# Patient Record
Sex: Female | Born: 1980 | Race: Black or African American | Hispanic: No | Marital: Single | Smoking: Former smoker
Health system: Southern US, Community
[De-identification: ages and names within clinical notes are randomized; demographics above are authoritative.]

## PROBLEM LIST (undated history)

## (undated) DIAGNOSIS — D649 Anemia, unspecified: Secondary | ICD-10-CM

## (undated) DIAGNOSIS — M549 Dorsalgia, unspecified: Secondary | ICD-10-CM

## (undated) HISTORY — PX: INDUCED ABORTION: SHX677

## (undated) HISTORY — PX: DILATION AND CURETTAGE, DIAGNOSTIC / THERAPEUTIC: SUR384

## (undated) HISTORY — PX: WISDOM TOOTH EXTRACTION: SHX21

---

## 2009-04-29 ENCOUNTER — Emergency Department (HOSPITAL_COMMUNITY): Admission: EM | Admit: 2009-04-29 | Discharge: 2009-04-29 | Payer: Self-pay | Admitting: *Deleted

## 2009-06-17 ENCOUNTER — Emergency Department (HOSPITAL_COMMUNITY): Admission: EM | Admit: 2009-06-17 | Discharge: 2009-06-17 | Payer: Self-pay | Admitting: Emergency Medicine

## 2009-06-20 ENCOUNTER — Emergency Department (HOSPITAL_COMMUNITY): Admission: EM | Admit: 2009-06-20 | Discharge: 2009-06-20 | Payer: Self-pay | Admitting: Emergency Medicine

## 2010-09-13 ENCOUNTER — Emergency Department (HOSPITAL_COMMUNITY)
Admission: EM | Admit: 2010-09-13 | Discharge: 2010-09-13 | Payer: Self-pay | Source: Home / Self Care | Admitting: Emergency Medicine

## 2011-01-12 LAB — CBC
HCT: 35.3 % — ABNORMAL LOW (ref 36.0–46.0)
Hemoglobin: 11.8 g/dL — ABNORMAL LOW (ref 12.0–15.0)
Platelets: 176 10*3/uL (ref 150–400)
RDW: 14.4 % (ref 11.5–15.5)
WBC: 7.5 10*3/uL (ref 4.0–10.5)

## 2011-01-12 LAB — URINALYSIS, ROUTINE W REFLEX MICROSCOPIC
Glucose, UA: NEGATIVE mg/dL
Hgb urine dipstick: NEGATIVE
Protein, ur: NEGATIVE mg/dL
Specific Gravity, Urine: 1.022 (ref 1.005–1.030)
pH: 6 (ref 5.0–8.0)

## 2011-01-12 LAB — LIPASE, BLOOD: Lipase: 15 U/L (ref 11–59)

## 2011-01-12 LAB — DIFFERENTIAL
Eosinophils Relative: 0 % (ref 0–5)
Lymphocytes Relative: 14 % (ref 12–46)
Lymphs Abs: 1.1 10*3/uL (ref 0.7–4.0)
Neutro Abs: 6.2 10*3/uL (ref 1.7–7.7)

## 2011-01-12 LAB — COMPREHENSIVE METABOLIC PANEL
AST: 24 U/L (ref 0–37)
BUN: 6 mg/dL (ref 6–23)
CO2: 17 mEq/L — ABNORMAL LOW (ref 19–32)
Calcium: 8.9 mg/dL (ref 8.4–10.5)
Creatinine, Ser: 0.68 mg/dL (ref 0.4–1.2)
GFR calc Af Amer: >60 mL/min (ref >60)
GFR calc non Af Amer: >60 mL/min (ref >60)

## 2011-01-12 LAB — POCT PREGNANCY, URINE: Preg Test, Ur: NEGATIVE

## 2011-01-14 LAB — POCT PREGNANCY, URINE: Preg Test, Ur: POSITIVE

## 2011-04-23 ENCOUNTER — Emergency Department (HOSPITAL_COMMUNITY)
Admission: EM | Admit: 2011-04-23 | Discharge: 2011-04-23 | Disposition: A | Discharge status: Home / Self Care | Payer: Self-pay | Attending: Emergency Medicine | Admitting: Emergency Medicine

## 2011-04-23 DIAGNOSIS — R21 Rash and other nonspecific skin eruption: Secondary | ICD-10-CM | POA: Insufficient documentation

## 2013-02-25 ENCOUNTER — Encounter (HOSPITAL_COMMUNITY): Payer: Self-pay | Admitting: Family Medicine

## 2013-02-25 ENCOUNTER — Emergency Department (HOSPITAL_COMMUNITY)
Admission: EM | Admit: 2013-02-25 | Discharge: 2013-02-25 | Disposition: A | Discharge status: Home / Self Care | Payer: Self-pay | Attending: Emergency Medicine | Admitting: Emergency Medicine

## 2013-02-25 DIAGNOSIS — Y929 Unspecified place or not applicable: Secondary | ICD-10-CM | POA: Insufficient documentation

## 2013-02-25 DIAGNOSIS — X58XXXA Exposure to other specified factors, initial encounter: Secondary | ICD-10-CM | POA: Insufficient documentation

## 2013-02-25 DIAGNOSIS — Y939 Activity, unspecified: Secondary | ICD-10-CM | POA: Insufficient documentation

## 2013-02-25 DIAGNOSIS — S239XXA Sprain of unspecified parts of thorax, initial encounter: Secondary | ICD-10-CM | POA: Insufficient documentation

## 2013-02-25 DIAGNOSIS — F17200 Nicotine dependence, unspecified, uncomplicated: Secondary | ICD-10-CM | POA: Insufficient documentation

## 2013-02-25 DIAGNOSIS — M549 Dorsalgia, unspecified: Secondary | ICD-10-CM

## 2013-02-25 DIAGNOSIS — T148XXA Other injury of unspecified body region, initial encounter: Secondary | ICD-10-CM

## 2013-02-25 MED ORDER — CYCLOBENZAPRINE HCL 10 MG PO TABS: 10.0000 mg | ORAL_TABLET | Freq: Two times a day (BID) | ORAL | Status: DC | PRN

## 2013-02-25 MED ORDER — IBUPROFEN 800 MG PO TABS: 800.0000 mg | ORAL_TABLET | Freq: Three times a day (TID) | ORAL | Status: DC

## 2013-02-25 NOTE — ED Notes (Signed)
C/O left mid back pain. 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 Describes as "tight". Denies numbness or tingling.

## 2013-02-25 NOTE — ED Provider Notes (Signed)
History     CSN: [id number]  Arrival date & time 02/25/13  4098   First MD Initiated Contact with Patient 02/25/13 1002      Chief Complaint  Patient presents with  . Back Pain    (Consider location/radiation/quality/duration/timing/severity/associated sxs/prior treatment) Patient is a 32 y.o. female presenting with back pain. The history is provided by the patient. No language interpreter was used.  Back Pain Location:  Thoracic spine Quality:  Aching Associated symptoms: no fever and no numbness   Associated symptoms comment:  Pain, worse with movement and better with rest, to left lateral thoracic and right lower back. No SOB or painful breathing. No cough or fever. No urinary symptoms.    History reviewed. No pertinent past medical history.  History reviewed. No pertinent past surgical history.  History reviewed. No pertinent family history.  History  Substance Use Topics  . Smoking status: Current Every Day Smoker  . Smokeless tobacco: Not on file  . Alcohol Use: Yes     Comment: occ    OB History   Grav Para Term Preterm Abortions TAB SAB Ect Mult Living                  Review of Systems  Constitutional: Negative for fever and chills.  Respiratory: Negative.   Cardiovascular: Negative.   Gastrointestinal: Negative.   Musculoskeletal: Positive for back pain.       See HPI.  Skin: Negative.   Neurological: Negative.  Negative for numbness.    Allergies  Review of patient's allergies indicates no known allergies.  Home Medications   Current Outpatient Rx  Name  Route  Sig  Dispense  Refill  . ibuprofen (ADVIL,MOTRIN) 200 MG tablet   Oral   Take 100 mg by mouth every 6 (six) hours as needed for pain.           BP 113/69  Pulse 77  Temp(Src) 98.3 F (36.8 C)  Resp 18  SpO2 96%  LMP 02/11/2013  Physical Exam  Constitutional: She appears well-developed and well-nourished.  HENT:  Head: Normocephalic.  Neck: Normal range of motion. Neck  supple.  Cardiovascular: Normal rate and regular rhythm.   Pulmonary/Chest: Effort normal and breath sounds normal.  Abdominal: Soft. Bowel sounds are normal. There is no tenderness. There is no rebound and no guarding.  Musculoskeletal: Normal range of motion.  Mild left lateral parathoracic tenderness without swelling or discoloration.  Neurological: She is alert. She has normal reflexes. Coordination normal.  Skin: Skin is warm and dry. No rash noted.  Psychiatric: She has a normal mood and affect.    ED Course  Procedures (including critical care time)  Labs Reviewed - No data to display No results found.   No diagnosis found.  1. Muscle strain   MDM  Uncomplicated muscular back pain.        Arnoldo Hooker, PA-C 02/25/13 1031

## 2013-02-25 NOTE — ED Provider Notes (Signed)
Medical screening examination/treatment/procedure(s) were performed by non-physician practitioner and as supervising physician I was immediately available for consultation/collaboration.  Calista Crain R. Haven Foss, MD 02/25/13 1553 

## 2013-02-25 NOTE — ED Notes (Signed)
Per pt sts back pain that started yesterday. sts upper back pain on the left side. Denies injury. sts hx of back issues.

## 2013-06-07 ENCOUNTER — Emergency Department (HOSPITAL_COMMUNITY)
Admission: EM | Admit: 2013-06-07 | Discharge: 2013-06-07 | Disposition: A | Discharge status: Home / Self Care | Payer: Self-pay | Attending: Emergency Medicine | Admitting: Emergency Medicine

## 2013-06-07 ENCOUNTER — Encounter (HOSPITAL_COMMUNITY): Payer: Self-pay | Admitting: Emergency Medicine

## 2013-06-07 DIAGNOSIS — Z3202 Encounter for pregnancy test, result negative: Secondary | ICD-10-CM | POA: Insufficient documentation

## 2013-06-07 DIAGNOSIS — M62830 Muscle spasm of back: Secondary | ICD-10-CM

## 2013-06-07 DIAGNOSIS — F17200 Nicotine dependence, unspecified, uncomplicated: Secondary | ICD-10-CM | POA: Insufficient documentation

## 2013-06-07 DIAGNOSIS — M5380 Other specified dorsopathies, site unspecified: Secondary | ICD-10-CM | POA: Insufficient documentation

## 2013-06-07 LAB — POCT PREGNANCY, URINE: Preg Test, Ur: NEGATIVE

## 2013-06-07 MED ORDER — METHOCARBAMOL 100 MG/ML IJ SOLN: 1000.0000 mg | Freq: Once | INTRAMUSCULAR | Status: DC

## 2013-06-07 MED ORDER — METHOCARBAMOL 500 MG PO TABS
1000.0000 mg | ORAL_TABLET | Freq: Once | ORAL | Status: AC
  Administered 2013-06-07: 1000 mg via ORAL
  Filled 2013-06-07: qty 2

## 2013-06-07 MED ORDER — METHOCARBAMOL 500 MG PO TABS: 1500.0000 mg | ORAL_TABLET | Freq: Three times a day (TID) | ORAL | Status: DC

## 2013-06-07 NOTE — ED Provider Notes (Signed)
CSN: [id number]     Arrival date & time 06/07/13  1191 History   First MD Initiated Contact with Patient 06/07/13 [phone]     Chief Complaint  Patient presents with  . Spasms    Back   (Consider location/radiation/quality/duration/timing/severity/associated sxs/prior Treatment) HPI Comments: 32 year old female with approximately 9-10 hours upper back spasms. States she's been getting these for about 10 years and this is definitely a recurrent symptom. She denies any pain. She's tried Flexeril at home without any relief. She's also tried ibuprofen and a heating pad. She is unable rated as obtained because of more feels tight like a spasm. No weakness, numbness, or midline symptoms. She is not a bowel or bladder incontinence. She denies any recent sick symptoms, such as fever, vomiting, diarrhea, abdominal pain, or dysuria. Her mom does note that she was carrying a large child last night before this started and wonders if this could be a cause of her spasm.  The history is provided by the patient and a parent.    History reviewed. No pertinent past medical history. History reviewed. No pertinent past surgical history. No family history on file. History  Substance Use Topics  . Smoking status: Current Every Day Smoker  . Smokeless tobacco: Not on file  . Alcohol Use: Yes     Comment: occ   OB History   Grav Para Term Preterm Abortions TAB SAB Ect Mult Living                 Review of Systems  Constitutional: Negative for fever and chills.  Gastrointestinal: Negative for nausea, vomiting, abdominal pain and diarrhea.  Genitourinary: Negative for dysuria and menstrual problem.  Musculoskeletal:       Upper back spasm  Neurological: Negative for weakness and numbness.  All other systems reviewed and are negative.    Allergies  Review of patient's allergies indicates no known allergies.  Home Medications   Current Outpatient Rx  Name  Route  Sig  Dispense  Refill  .  cyclobenzaprine (FLEXERIL) 10 MG tablet   Oral   Take 1 tablet (10 mg total) by mouth 2 (two) times daily as needed for muscle spasms.   20 tablet   0   . ibuprofen (ADVIL,MOTRIN) 200 MG tablet   Oral   Take 200 mg by mouth every 6 (six) hours as needed for pain.           BP 101/64  Pulse 60  Temp(Src) 98.1 F (36.7 C) (Oral)  Resp 18  Ht 5\' 8"  (1.727 m)  Wt 168 lb (76.204 kg)  BMI 25.55 kg/m2  SpO2 100%  LMP 05/27/2013 Physical Exam  Nursing note and vitals reviewed. Constitutional: She is oriented to person, place, and time. She appears well-developed and well-nourished. No distress.  HENT:  Head: Normocephalic and atraumatic.  Right Ear: External ear normal.  Left Ear: External ear normal.  Nose: Nose normal.  Eyes: Right eye exhibits no discharge. Left eye exhibits no discharge.  Cardiovascular: Normal rate, regular rhythm, normal heart sounds and intact distal pulses.   Pulmonary/Chest: Effort normal and breath sounds normal.  Abdominal: Soft. There is no tenderness.  Musculoskeletal:       Cervical back: She exhibits no tenderness, no bony tenderness and no spasm.       Thoracic back: She exhibits no tenderness, no bony tenderness and no spasm.  Neurological: She is alert and oriented to person, place, and time. She has normal strength and normal reflexes.  No sensory deficit. She exhibits normal muscle tone.  Skin: Skin is warm and dry.    ED Course  Procedures (including critical care time) Labs Review Labs Reviewed  POCT PREGNANCY, URINE   Imaging Review No results found.  MDM   1. Spasm of back muscles    Patient is a normal exam. Has no red flags, midline tenderness, or abnormal neuro exam. She's had this for 10 years or more I feel that lab work or further evaluation is not necessary. The patient only seemed to have mild discomfort. She is on Flexeril at home will change to Robaxin in hopes of getting better results. She denies pain I do not feel any  narcotics or other pain meds will help. Discussed symptomatic care as well as feeding to follow up with her PCP. The patient is stable for discharge.    Audree Camel, MD 06/07/13 [phone]

## 2013-06-07 NOTE — ED Notes (Signed)
Pt c/o having back spasms onset 2300-2400 last night. Pt denies recent injury. Pt denies urinary symptoms or bowel symptoms. Pt able to move all extremities without difficulty. Pt took ibuprofen at 0530.

## 2013-09-25 ENCOUNTER — Encounter (HOSPITAL_COMMUNITY): Payer: Self-pay | Admitting: Emergency Medicine

## 2013-09-25 ENCOUNTER — Emergency Department (HOSPITAL_COMMUNITY)
Admission: EM | Admit: 2013-09-25 | Discharge: 2013-09-25 | Disposition: A | Discharge status: Home / Self Care | Payer: No Typology Code available for payment source | Attending: Emergency Medicine | Admitting: Emergency Medicine

## 2013-09-25 DIAGNOSIS — IMO0002 Reserved for concepts with insufficient information to code with codable children: Secondary | ICD-10-CM

## 2013-09-25 DIAGNOSIS — Z79899 Other long term (current) drug therapy: Secondary | ICD-10-CM | POA: Insufficient documentation

## 2013-09-25 DIAGNOSIS — F17200 Nicotine dependence, unspecified, uncomplicated: Secondary | ICD-10-CM | POA: Insufficient documentation

## 2013-09-25 DIAGNOSIS — F411 Generalized anxiety disorder: Secondary | ICD-10-CM | POA: Insufficient documentation

## 2013-09-25 MED ORDER — METRONIDAZOLE 500 MG PO TABS
ORAL_TABLET | ORAL | Status: AC
  Administered 2013-09-25: 2000 mg
  Filled 2013-09-25: qty 4

## 2013-09-25 MED ORDER — LEVONORGESTREL 0.75 MG PO TABS
ORAL_TABLET | ORAL | Status: AC
  Administered 2013-09-25: 1.5 mg via GASTROSTOMY
  Filled 2013-09-25: qty 2

## 2013-09-25 MED ORDER — AZITHROMYCIN 1 G PO PACK
PACK | ORAL | Status: AC
  Administered 2013-09-25: 1 g
  Filled 2013-09-25: qty 1

## 2013-09-25 MED ORDER — CEFIXIME 400 MG PO TABS
ORAL_TABLET | ORAL | Status: AC
  Administered 2013-09-25: 400 mg
  Filled 2013-09-25: qty 1

## 2013-09-25 MED ORDER — PROMETHAZINE HCL 25 MG PO TABS
ORAL_TABLET | ORAL | Status: AC
  Administered 2013-09-25: 25 mg
  Filled 2013-09-25: qty 3
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
## 2013-12-17 ENCOUNTER — Emergency Department (INDEPENDENT_AMBULATORY_CARE_PROVIDER_SITE_OTHER)
Admission: EM | Admit: 2013-12-17 | Discharge: 2013-12-17 | Disposition: A | Discharge status: Home / Self Care | Payer: Self-pay | Source: Home / Self Care | Attending: Family Medicine | Admitting: Family Medicine

## 2013-12-17 ENCOUNTER — Encounter (HOSPITAL_COMMUNITY): Payer: Self-pay | Admitting: Emergency Medicine

## 2013-12-17 DIAGNOSIS — S29012A Strain of muscle and tendon of back wall of thorax, initial encounter: Secondary | ICD-10-CM

## 2013-12-17 DIAGNOSIS — S239XXA Sprain of unspecified parts of thorax, initial encounter: Secondary | ICD-10-CM

## 2013-12-17 MED ORDER — METHOCARBAMOL 500 MG PO TABS: 1000.0000 mg | ORAL_TABLET | Freq: Three times a day (TID) | ORAL | Status: DC | PRN

## 2013-12-17 MED ORDER — METHYLPREDNISOLONE ACETATE 80 MG/ML IJ SUSP
INTRAMUSCULAR | Status: AC
  Filled 2013-12-17: qty 1

## 2013-12-17 MED ORDER — KETOROLAC TROMETHAMINE 60 MG/2ML IM SOLN
60.0000 mg | Freq: Once | INTRAMUSCULAR | Status: AC
  Administered 2013-12-17: 60 mg via INTRAMUSCULAR

## 2013-12-17 MED ORDER — METHYLPREDNISOLONE ACETATE 40 MG/ML IJ SUSP
INTRAMUSCULAR | Status: AC
  Filled 2013-12-17: qty 5

## 2013-12-17 MED ORDER — KETOROLAC TROMETHAMINE 60 MG/2ML IM SOLN
INTRAMUSCULAR | Status: AC
  Filled 2013-12-17: qty 2

## 2013-12-17 MED ORDER — TRAMADOL HCL 50 MG PO TABS: 50.0000 mg | ORAL_TABLET | Freq: Four times a day (QID) | ORAL | Status: DC | PRN

## 2013-12-17 MED ORDER — METHYLPREDNISOLONE ACETATE 80 MG/ML IJ SUSP
80.0000 mg | Freq: Once | INTRAMUSCULAR | Status: AC
  Administered 2013-12-17: 80 mg via INTRAMUSCULAR

## 2013-12-17 MED ORDER — DICLOFENAC SODIUM 50 MG PO TBEC: 50.0000 mg | DELAYED_RELEASE_TABLET | Freq: Two times a day (BID) | ORAL | Status: DC | PRN

## 2013-12-17 NOTE — Discharge Instructions (Signed)
Thank you for coming in today. Come back or go to the emergency room if you notice new weakness new numbness problems walking or bowel or bladder problems. Take the medicines as needed.  Use a heating pad.  Stay active.     Back Exercises These exercises may help you when beginning to rehabilitate your injury. Your symptoms may resolve with or without further involvement from your physician, physical therapist or athletic trainer. While completing these exercises, remember:   Restoring tissue flexibility helps normal motion to return to the joints. This allows healthier, less painful movement and activity.  An effective stretch should be held for at least 30 seconds.  A stretch should never be painful. You should only feel a gentle lengthening or release in the stretched tissue. STRETCH  Extension, Prone on Elbows   Lie on your stomach on the floor, a bed will be too soft. Place your palms about shoulder width apart and at the height of your head.  Place your elbows under your shoulders. If this is too painful, stack pillows under your chest.  Allow your body to relax so that your hips drop lower and make contact more completely with the floor.  Hold this position for __________ seconds.  Slowly return to lying flat on the floor. Repeat __________ times. Complete this exercise __________ times per day.  RANGE OF MOTION  Extension, Prone Press Ups   Lie on your stomach on the floor, a bed will be too soft. Place your palms about shoulder width apart and at the height of your head.  Keeping your back as relaxed as possible, slowly straighten your elbows while keeping your hips on the floor. You may adjust the placement of your hands to maximize your comfort. As you gain motion, your hands will come more underneath your shoulders.  Hold this position __________ seconds.  Slowly return to lying flat on the floor. Repeat __________ times. Complete this exercise __________ times per  day.  RANGE OF MOTION- Quadruped, Neutral Spine   Assume a hands and knees position on a firm surface. Keep your hands under your shoulders and your knees under your hips. You may place padding under your knees for comfort.  Drop your head and point your tail bone toward the ground below you. This will round out your low back like an angry cat. Hold this position for __________ seconds.  Slowly lift your head and release your tail bone so that your back sags into a large arch, like an old horse.  Hold this position for __________ seconds.  Repeat this until you feel limber in your low back.  Now, find your "sweet spot." This will be the most comfortable position somewhere between the two previous positions. This is your neutral spine. Once you have found this position, tense your stomach muscles to support your low back.  Hold this position for __________ seconds. Repeat __________ times. Complete this exercise __________ times per day.  STRETCH  Flexion, Single Knee to Chest   Lie on a firm bed or floor with both legs extended in front of you.  Keeping one leg in contact with the floor, bring your opposite knee to your chest. Hold your leg in place by either grabbing behind your thigh or at your knee.  Pull until you feel a gentle stretch in your low back. Hold __________ seconds.  Slowly release your grasp and repeat the exercise with the opposite side. Repeat __________ times. Complete this exercise __________ times per day.  STRETCH - Hamstrings, Standing  Stand or sit and extend your right / left leg, placing your foot on a chair or foot stool  Keeping a slight arch in your low back and your hips straight forward.  Lead with your chest and lean forward at the waist until you feel a gentle stretch in the back of your right / left knee or thigh. (When done correctly, this exercise requires leaning only a small distance.)  Hold this position for __________ seconds. Repeat  __________ times. Complete this stretch __________ times per day. STRENGTHENING  Deep Abdominals, Pelvic Tilt   Lie on a firm bed or floor. Keeping your legs in front of you, bend your knees so they are both pointed toward the ceiling and your feet are flat on the floor.  Tense your lower abdominal muscles to press your low back into the floor. This motion will rotate your pelvis so that your tail bone is scooping upwards rather than pointing at your feet or into the floor.  With a gentle tension and even breathing, hold this position for __________ seconds. Repeat __________ times. Complete this exercise __________ times per day.  STRENGTHENING  Abdominals, Crunches   Lie on a firm bed or floor. Keeping your legs in front of you, bend your knees so they are both pointed toward the ceiling and your feet are flat on the floor. Cross your arms over your chest.  Slightly tip your chin down without bending your neck.  Tense your abdominals and slowly lift your trunk high enough to just clear your shoulder blades. Lifting higher can put excessive stress on the low back and does not further strengthen your abdominal muscles.  Control your return to the starting position. Repeat __________ times. Complete this exercise __________ times per day.  STRENGTHENING  Quadruped, Opposite UE/LE Lift   Assume a hands and knees position on a firm surface. Keep your hands under your shoulders and your knees under your hips. You may place padding under your knees for comfort.  Find your neutral spine and gently tense your abdominal muscles so that you can maintain this position. Your shoulders and hips should form a rectangle that is parallel with the floor and is not twisted.  Keeping your trunk steady, lift your right hand no higher than your shoulder and then your left leg no higher than your hip. Make sure you are not holding your breath. Hold this position __________ seconds.  Continuing to keep your  abdominal muscles tense and your back steady, slowly return to your starting position. Repeat with the opposite arm and leg. Repeat __________ times. Complete this exercise __________ times per day. Document Released: 10/12/2005 Document Revised: 12/17/2011 Document Reviewed: 01/06/2009 [facility] Patient Information 2014 [place], [place].

## 2013-12-17 NOTE — ED Provider Notes (Signed)
Kerri Baldwin is a 33 y.o. female who presents to Urgent Care today for thoracic back pain. Symptoms started today. Patient denies any injury. She denies any significant radiating pain. The pain is located in the bilateral rhomboid area and is worse with trunk motion and arm motion. She feels well otherwise. She has tried ibuprofen and Levoxine. She has a history of intermittent muscle spasms in the low thoracic back. This is worse than usual. No fevers or chills nausea vomiting or diarrhea   History reviewed. No pertinent past medical history. History  Substance Use Topics  . Smoking status: Current Every Day Smoker  . Smokeless tobacco: Not on file  . Alcohol Use: Yes     Comment: occ   ROS as above Medications: Current Facility-Administered Medications  Medication Dose Route Frequency Provider Last Rate Last Dose  . ketorolac (TORADOL) injection 60 mg  60 mg Intramuscular Once Rodolph BongEvan S Dondrell Loudermilk, MD      . methylPREDNISolone acetate (DEPO-MEDROL) injection 80 mg  80 mg Intramuscular Once Rodolph BongEvan S Halston Kintz, MD       Current Outpatient Prescriptions  Medication Sig Dispense Refill  . diclofenac (VOLTAREN) 50 MG EC tablet Take 1 tablet (50 mg total) by mouth 2 (two) times daily as needed.  60 tablet  0  . methocarbamol (ROBAXIN) 500 MG tablet Take 2 tablets (1,000 mg total) by mouth every 8 (eight) hours as needed for muscle spasms (back pain).  90 tablet  0  . traMADol (ULTRAM) 50 MG tablet Take 1 tablet (50 mg total) by mouth every 6 (six) hours as needed.  15 tablet  0    Exam:  BP 105/72  Pulse 72  Temp(Src) 98.2 F (36.8 C) (Oral)  Resp 18  SpO2 100%  LMP 12/03/2013 Gen: Well NAD HEENT: EOMI,  MMM Lungs: Normal work of breathing. CTABL Heart: RRR no MRG Exts: Brisk capillary refill, warm and well perfused.  Back: Nontender to spinal midline. Tender palpation bilateral rhomboids. Normal scapular motion. Normal back range of motion. Reflexes bilateral upper extremities are  intact. Strength is intact bilateral upper extremities. Sensation is intact bilateral upper extremities.    Assessment and Plan: 33 y.o. female with rhomboid muscle strain. Plan to treat with heating pad, diclofenac, methocarbamol, and tramadol.  Discussed warning signs or symptoms. Please see discharge instructions. Patient expresses understanding.    Rodolph BongEvan S Drayven Marchena, MD 12/17/13 [phone]

## 2013-12-17 NOTE — ED Notes (Signed)
Patient states that when she got out of the bed this morning her back "exploded" and every time she moved she states shooting pain.

## 2014-02-25 ENCOUNTER — Encounter (HOSPITAL_COMMUNITY): Payer: Self-pay | Admitting: Emergency Medicine

## 2014-02-25 ENCOUNTER — Emergency Department (INDEPENDENT_AMBULATORY_CARE_PROVIDER_SITE_OTHER)
Admission: EM | Admit: 2014-02-25 | Discharge: 2014-02-25 | Disposition: A | Discharge status: Home / Self Care | Payer: Self-pay | Source: Home / Self Care | Attending: Family Medicine | Admitting: Family Medicine

## 2014-02-25 DIAGNOSIS — N72 Inflammatory disease of cervix uteri: Secondary | ICD-10-CM

## 2014-02-25 DIAGNOSIS — N7689 Other specified inflammation of vagina and vulva: Secondary | ICD-10-CM

## 2014-02-25 DIAGNOSIS — N759 Disease of Bartholin's gland, unspecified: Secondary | ICD-10-CM

## 2014-02-25 HISTORY — DX: Dorsalgia, unspecified: M54.9

## 2014-02-25 NOTE — ED Provider Notes (Signed)
CSN: [id number]     Arrival date & time 02/25/14  1313 History   None    Chief Complaint  Patient presents with  . Cyst   (Consider location/radiation/quality/duration/timing/severity/associated sxs/prior Treatment) HPI Comments: 33 year old female presents for evaluation of a bump on her vagina or her leg on the left side. She first noticed it this morning. There is no pain associated with this, she just happened to notice it so she came in to be seen. She has no other symptoms. She has never had any issues similar to this before. All other review of systems are negative.   Past Medical History  Diagnosis Date  . Back pain    History reviewed. No pertinent past surgical history. No family history on file. History  Substance Use Topics  . Smoking status: Current Every Day Smoker  . Smokeless tobacco: Not on file  . Alcohol Use: Yes     Comment: occ   OB History   Grav Para Term Preterm Abortions TAB SAB Ect Mult Living                 Review of Systems  Genitourinary: Positive for genital sores (see history of present illness). Negative for dysuria, urgency, frequency, hematuria, flank pain, vaginal bleeding, vaginal discharge, vaginal pain, menstrual problem and pelvic pain.  All other systems reviewed and are negative.   Allergies  Review of patient's allergies indicates no known allergies.  Home Medications   Prior to Admission medications   Medication Sig Start Date End Date Taking? Authorizing Provider  diclofenac (VOLTAREN) 50 MG EC tablet Take 1 tablet (50 mg total) by mouth 2 (two) times daily as needed. 12/17/13   Rodolph BongEvan S Corey, MD  methocarbamol (ROBAXIN) 500 MG tablet Take 2 tablets (1,000 mg total) by mouth every 8 (eight) hours as needed for muscle spasms (back pain). 12/17/13   Rodolph BongEvan S Corey, MD  traMADol (ULTRAM) 50 MG tablet Take 1 tablet (50 mg total) by mouth every 6 (six) hours as needed. 12/17/13   Rodolph BongEvan S Corey, MD   BP 125/76  Pulse 80  Temp(Src) 98.3 F  (36.8 C) (Oral)  Resp 12  SpO2 99%  LMP 02/04/2014 Physical Exam  Nursing note and vitals reviewed. Constitutional: She is oriented to person, place, and time. Vital signs are normal. She appears well-developed and well-nourished. No distress.  HENT:  Head: Normocephalic and atraumatic.  Pulmonary/Chest: Effort normal. No respiratory distress.  Genitourinary: Vagina normal. There is no tenderness or lesion on the right labia. There is lesion (The left sided Bartholin's gland large, firm, nontender, mobile externally but fixed internally as if on a stalk) on the left labia. There is no tenderness on the left labia. Cervix exhibits no discharge and no friability.  Lymphadenopathy:       Right: No inguinal adenopathy present.       Left: No inguinal adenopathy present.  Neurological: She is alert and oriented to person, place, and time. She has normal strength. Coordination normal.  Skin: Skin is warm and dry. No rash noted. She is not diaphoretic.  Psychiatric: She has a normal mood and affect. Judgment normal.    ED Course  Procedures (including critical care time) Labs Review Labs Reviewed - No data to display  Imaging Review No results found.   MDM   1. Disorder of Bartholin's gland    Before so the cause of this. She may have possible biopsy if this does not resolve. [facility]       Graylon GoodZachary H Rainee Sweatt, PA-C 02/25/14 1554

## 2014-02-25 NOTE — Discharge Instructions (Signed)
Disorders of the Vulva It is important to know the outside (external) area of the female genitalia to properly examine yourself. The vulva or labia, sometimes also called the lips around the vagina, covers the pubic bone and surrounds the vagina. The larger or outer labia is called the labia majora. The inner or smaller labia is called the labia minora. The clitoris is at the top of the vulva. It is covered by a small area of tissue called the hood. Below the clitoris is the opening of the tube from the bladder, which is the urethral opening. Just below the urethral opening is the opening of the vagina. This area is called the vestibule. Inside the vestibule are bartholin and skene glands that lubricate the outside of the vagina during sexual intercourse. The perineum is the area that lies between the vagina and anus. You should examine your external genitalia once a month just as you do your self breast examination. SIGNS AND SYMPTOMS TO LOOK FOR DURING YOUR EXAMINATION:  Swelling.  Redness.  Bumps.  Blisters.  Black or white areas.  Itching.  Bleeding.  Burning. TYPES OF VULVAR PROBLEMS  Infections.  Yeast (fungus) is the most common infection that causes redness, swelling, itching and a thick white vaginal discharge. Women with diabetes, taking medicines that kill germs (antibiotics) or on birth control pills are at risk for yeast infection. This infection is treated with vaginal creams, suppositories and anti-itch cream for the vulva.  Genital warts (condyloma) are caused by the human papilloma virus. They are raised bumps that can itch, bleed, cause discomfort and sometimes appear in groups like cauliflower. This is a sexually transmitted disease (STD) caused by sexual contact. This can be treated with topical medication, freezing, electrocautery, laser or surgical removal.  Genital herpes is a virus that causes redness, swelling, blisters and ulcers that can cause itching and are  very painful. This is also a STD. There are medications to control the symptoms of herpes, but there is no cure. Once you have it, you keep getting it because the virus stays in your body.  Contact dermatitis. Contact dermatitis is an irritation of the vulva that can cause redness, swelling and itching. It can be caused by:  Perfumed toilet paper.  Deodorants.  Talcum powder.  Hygiene sprays.  Tampons.  Soaps and fabric softeners.  Wearing wet underwear and bathing suits too long.  Spermicide.  Condoms.  Diaphragms.  Poison ivy. Treatment will depend on eliminating the cause and treating the symptoms.  Vulvodynia.  Vulvodynia is "painful vulva." It includes burning, itching, irritation and a feeling of rawness or bruising of the vulva. Treating this problem depends on the cause and diagnosis. Treatment can take a long time.  Vulvar Dystrophy.  Vulvar Dystrophy is a disorder of the skin of the vulva. The skin can be too thick with hard raised patches or too thin skin showing wrinkles. Vulvar dystrophy can cause redness or white patches, itching and burning sensation. A biopsy may be needed to diagnose the problem. Treatment with creams or ointments depends on the diagnosis.  Vulvar Cancer.  Vulvar cancer is usually a form of squamus skin cancer. Other types of vulvar cancer like melenoma (a dark or black, irregular shaped mole that can bleed easily) and adenocarcinoma (red, itchy, scaly area that looks like eczema) are rare. Treatment is usually surgery to remove the cancerous area, the vulva and the lymph nodes in the groin. This can be done with or without radiation and chemotherapy. HOME   CARE INSTRUCTIONS   Look at your vulva and external genital area every month.  Follow and finish all treatment given to you by your caregiver.  Do not use perfumed or scented soaps, detergents, hygiene spray, talcum powder, tampons, douches or toilet paper.  Remove your tampon every 6  hours. Do not leave tampons in overnight.  Wear loose-fitting clothing.  Wear underwear that is cotton.  Avoid spermicidal creams or foams that irritate you. SEEK MEDICAL CARE IF:   You notice changes on your vulva such as redness, swelling, itching, color changes, bleeding, small bumps, blisters or any discomfort.  You develop an abnormal vaginal discharge.  You have painful sexual intercourse.  You notice swelling of the lymph nodes in your groin. Document Released: 03/12/2008 Document Revised: 12/17/2011 Document Reviewed: 12/25/2010 [employer] Patient Information 2014 [employer].  

## 2014-02-25 NOTE — ED Notes (Signed)
Noticed a knot today, left labia /vagina

## 2014-03-01 NOTE — ED Provider Notes (Signed)
Medical screening examination/treatment/procedure(s) were performed by a resident physician or non-physician practitioner and as the supervising physician I was immediately available for consultation/collaboration.  Sayuri Rhames, MD    Esmae Donathan S Teran Knittle, MD 03/01/14 0850 

## 2014-03-28 ENCOUNTER — Encounter (HOSPITAL_COMMUNITY): Payer: Self-pay | Admitting: Emergency Medicine

## 2014-03-28 ENCOUNTER — Emergency Department (HOSPITAL_COMMUNITY)
Admission: EM | Admit: 2014-03-28 | Discharge: 2014-03-28 | Disposition: A | Discharge status: Home / Self Care | Payer: Self-pay | Attending: Emergency Medicine | Admitting: Emergency Medicine

## 2014-03-28 ENCOUNTER — Emergency Department (HOSPITAL_COMMUNITY): Payer: Self-pay

## 2014-03-28 DIAGNOSIS — S93402A Sprain of unspecified ligament of left ankle, initial encounter: Secondary | ICD-10-CM

## 2014-03-28 DIAGNOSIS — Y929 Unspecified place or not applicable: Secondary | ICD-10-CM | POA: Insufficient documentation

## 2014-03-28 DIAGNOSIS — S93409A Sprain of unspecified ligament of unspecified ankle, initial encounter: Secondary | ICD-10-CM | POA: Insufficient documentation

## 2014-03-28 DIAGNOSIS — Y939 Activity, unspecified: Secondary | ICD-10-CM | POA: Insufficient documentation

## 2014-03-28 DIAGNOSIS — F17200 Nicotine dependence, unspecified, uncomplicated: Secondary | ICD-10-CM | POA: Insufficient documentation

## 2014-03-28 DIAGNOSIS — X500XXA Overexertion from strenuous movement or load, initial encounter: Secondary | ICD-10-CM | POA: Insufficient documentation

## 2014-03-28 MED ORDER — IBUPROFEN 600 MG PO TABS: 600.0000 mg | ORAL_TABLET | Freq: Four times a day (QID) | ORAL | Status: DC | PRN

## 2014-03-28 NOTE — Discharge Instructions (Signed)
Ankle Sprain °An ankle sprain is an injury to the strong, fibrous tissues (ligaments) that hold the bones of your ankle joint together.  °CAUSES °An ankle sprain is usually caused by a fall or by twisting your ankle. Ankle sprains most commonly occur when you step on the outer edge of your foot, and your ankle turns inward. People who participate in sports are more prone to these types of injuries.  °SYMPTOMS  °· Pain in your ankle. The pain may be present at rest or only when you are trying to stand or walk. °· Swelling. °· Bruising. Bruising may develop immediately or within 1 to 2 days after your injury. °· Difficulty standing or walking, particularly when turning corners or changing directions. °DIAGNOSIS  °Your caregiver will ask you details about your injury and perform a physical exam of your ankle to determine if you have an ankle sprain. During the physical exam, your caregiver will press on and apply pressure to specific areas of your foot and ankle. Your caregiver will try to move your ankle in certain ways. An X-ray exam may be done to be sure a bone was not broken or a ligament did not separate from one of the bones in your ankle (avulsion fracture).  °TREATMENT  °Certain types of braces can help stabilize your ankle. Your caregiver can make a recommendation for this. Your caregiver may recommend the use of medicine for pain. If your sprain is severe, your caregiver may refer you to a surgeon who helps to restore function to parts of your skeletal system (orthopedist) or a physical therapist. °HOME CARE INSTRUCTIONS  °· Apply ice to your injury for 1-2 days or as directed by your caregiver. Applying ice helps to reduce inflammation and pain. °· Put ice in a plastic bag. °· Place a towel between your skin and the bag. °· Leave the ice on for 15-20 minutes at a time, every 2 hours while you are awake. °· Only take over-the-counter or prescription medicines for pain, discomfort, or fever as directed by  your caregiver. °· Elevate your injured ankle above the level of your heart as much as possible for 2-3 days. °· If your caregiver recommends crutches, use them as instructed. Gradually put weight on the affected ankle. Continue to use crutches or a cane until you can walk without feeling pain in your ankle. °· If you have a plaster splint, wear the splint as directed by your caregiver. Do not rest it on anything harder than a pillow for the first 24 hours. Do not put weight on it. Do not get it wet. You may take it off to take a shower or bath. °· You may have been given an elastic bandage to wear around your ankle to provide support. If the elastic bandage is too tight (you have numbness or tingling in your foot or your foot becomes cold and blue), adjust the bandage to make it comfortable. °· If you have an air splint, you may blow more air into it or let air out to make it more comfortable. You may take your splint off at night and before taking a shower or bath. Wiggle your toes in the splint several times per day to decrease swelling. °SEEK MEDICAL CARE IF:  °· You have rapidly increasing bruising or swelling. °· Your toes feel extremely cold or you lose feeling in your foot. °· Your pain is not relieved with medicine. °SEEK IMMEDIATE MEDICAL CARE IF: °· Your toes are numb or blue. °·   You have severe pain that is increasing. °MAKE SURE YOU:  °· Understand these instructions. °· Will watch your condition. °· Will get help right away if you are not doing well or get worse. °Document Released: 09/24/2005 Document Revised: 06/18/2012 Document Reviewed: 10/06/2011 °[employer]® Patient Information ©2015 [employer]. This information is not intended to replace advice given to you by your health care provider. Make sure you discuss any questions you have with your health care provider. °Stirrup Ankle Brace °Stirrup ankle braces give support and help stabilize the ankle joint. They are rigid pieces of plastic or  fiberglass that go up both sides of the lower leg with the bottom of the stirrup fitting comfortably under the bottom of the instep of the foot. It can be held on with Velcro straps or an elastic wrap. Stirrup ankle braces are used to support the ankle following mild or moderate sprains or strains, or fractures after cast removal.  °They can be easily removed or adjusted if there is swelling. The rigid brace shells are designed to fit the ankle comfortably and provide the needed medial/lateral stabilization. This brace can be easily worn with most athletic shoes. The brace liner is usually made of a soft, comfortable gel-like material. This gel fits the ankle well without causing uncomfortable pressure points.  °IMPORTANCE OF ANKLE BRACES: °· The use of ankle bracing is effective in the prevention of ankle sprains. °· In athletes, the use of ankle bracing will offer protection and prevent further sprains. °· Research shows that a complete rehabilitation program needs to be included with external bracing. This includes range of motion and ankle strengthening exercises. Your caregivers will instruct you in this. °If you were given the brace today for a new injury, use the following home care instructions as a guide. °HOME CARE INSTRUCTIONS  °· Apply ice to the sore area for 15-20 minutes, 03-04 times per day while awake for the first 2 days. Put the ice in a plastic bag and place a towel between the bag of ice and your skin. Never place the ice pack directly on your skin. Be especially careful using ice on an elbow or knee or other bony area, such as your ankle, because icing for too long may damage the nerves which are close to the surface. °· Keep your leg elevated when possible to lessen swelling. °· Wear your splint until you are seen for a follow-up examination. Do not put weight on it. Do not get it wet. You may take it off to take a shower or bath. °· For Activity: Use crutches with non-weight bearing for 1  week. Then, you may walk on your ankle as instructed. Start gradually with weight bearing on the affected ankle. °· Continue to use crutches or a cane until you can stand on your ankle without causing pain. °· Wiggle your toes in the splint several times per day if you are able. °· The splint is too tight if you have numbness, tingling, or if your foot becomes cold and blue. Adjust the straps or elastic bandage to make it comfortable. °· Only take over-the-counter or prescription medicines for pain, discomfort, or fever as directed by your caregiver. °SEEK IMMEDIATE MEDICAL CARE IF:  °· You have increased bruising, swelling or pain. °· Your toes are blue or cold and loosening the brace or wrap does not help. °· Your pain is not relieved with medicine. °MAKE SURE YOU:  °· Understand these instructions. °· Will watch your condition. °· Will   get help right away if you are not doing well or get worse. °Document Released: 07/25/2004 Document Revised: 12/17/2011 Document Reviewed: 12/27/2008 °ExitCare® Patient Information ©2015 [employer]. This information is not intended to replace advice given to you by your health care provider. Make sure you discuss any questions you have with your health care provider. ° °
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
## 2014-03-28 NOTE — ED Notes (Signed)
L ankle brace in plac, crutch training demonstrated, pt has no further questions at present.
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
## 2014-03-29 NOTE — ED Provider Notes (Signed)
Medical screening examination/treatment/procedure(s) were performed by non-physician practitioner and as supervising physician I was immediately available for consultation/collaboration.   Candyce ChurnJohn David Wofford III, MD 03/29/14 1131

## 2014-08-09 ENCOUNTER — Encounter (HOSPITAL_COMMUNITY): Payer: Self-pay | Admitting: Emergency Medicine

## 2015-04-11 ENCOUNTER — Emergency Department (HOSPITAL_COMMUNITY): Payer: Self-pay

## 2015-04-11 ENCOUNTER — Emergency Department (HOSPITAL_COMMUNITY)
Admission: EM | Admit: 2015-04-11 | Discharge: 2015-04-11 | Disposition: A | Discharge status: Home / Self Care | Payer: Self-pay | Attending: Emergency Medicine | Admitting: Emergency Medicine

## 2015-04-11 ENCOUNTER — Encounter (HOSPITAL_COMMUNITY): Payer: Self-pay | Admitting: Emergency Medicine

## 2015-04-11 DIAGNOSIS — Z720 Tobacco use: Secondary | ICD-10-CM | POA: Insufficient documentation

## 2015-04-11 DIAGNOSIS — Z862 Personal history of diseases of the blood and blood-forming organs and certain disorders involving the immune mechanism: Secondary | ICD-10-CM | POA: Insufficient documentation

## 2015-04-11 DIAGNOSIS — R0781 Pleurodynia: Secondary | ICD-10-CM | POA: Insufficient documentation

## 2015-04-11 HISTORY — DX: Anemia, unspecified: D64.9

## 2015-04-11 LAB — I-STAT BETA HCG BLOOD, ED (MC, WL, AP ONLY): I-stat hCG, quantitative: <5 m[IU]/mL (ref <5)

## 2015-04-11 MED ORDER — HYDROCODONE-ACETAMINOPHEN 5-325 MG PO TABS
2.0000 | ORAL_TABLET | Freq: Once | ORAL | Status: AC
  Administered 2015-04-11: 2 via ORAL
  Filled 2015-04-11: qty 2
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
## 2015-04-11 NOTE — ED Notes (Signed)
Patient transported to X-ray 

## 2015-04-11 NOTE — Discharge Instructions (Signed)
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
## 2015-06-19 ENCOUNTER — Encounter (HOSPITAL_COMMUNITY): Payer: Self-pay | Admitting: Emergency Medicine

## 2015-06-19 ENCOUNTER — Observation Stay (HOSPITAL_COMMUNITY)
Admission: EM | Admit: 2015-06-19 | Discharge: 2015-06-21 | Disposition: A | Discharge status: Home / Self Care | Payer: Self-pay | Attending: Internal Medicine | Admitting: Internal Medicine

## 2015-06-19 DIAGNOSIS — R109 Unspecified abdominal pain: Secondary | ICD-10-CM | POA: Insufficient documentation

## 2015-06-19 DIAGNOSIS — D649 Anemia, unspecified: Secondary | ICD-10-CM | POA: Insufficient documentation

## 2015-06-19 DIAGNOSIS — R740 Nonspecific elevation of levels of transaminase and lactic acid dehydrogenase [LDH]: Principal | ICD-10-CM | POA: Insufficient documentation

## 2015-06-19 DIAGNOSIS — Z791 Long term (current) use of non-steroidal anti-inflammatories (NSAID): Secondary | ICD-10-CM | POA: Insufficient documentation

## 2015-06-19 DIAGNOSIS — F17210 Nicotine dependence, cigarettes, uncomplicated: Secondary | ICD-10-CM | POA: Insufficient documentation

## 2015-06-19 DIAGNOSIS — E876 Hypokalemia: Secondary | ICD-10-CM | POA: Insufficient documentation

## 2015-06-19 DIAGNOSIS — R748 Abnormal levels of other serum enzymes: Secondary | ICD-10-CM

## 2015-06-19 LAB — COMPREHENSIVE METABOLIC PANEL
ALBUMIN: 4.1 g/dL (ref 3.5–5.0)
ALT: 79 U/L — ABNORMAL HIGH (ref 14–54)
ANION GAP: 9 (ref 5–15)
AST: 373 U/L — ABNORMAL HIGH (ref 15–41)
Alkaline Phosphatase: 96 U/L (ref 38–126)
BILIRUBIN TOTAL: 1.9 mg/dL — AB (ref 0.3–1.2)
BUN: 7 mg/dL (ref 6–20)
CO2: 22 mmol/L (ref 22–32)
Calcium: 9 mg/dL (ref 8.9–10.3)
Chloride: 105 mmol/L (ref 101–111)
Creatinine, Ser: 0.65 mg/dL (ref 0.44–1.00)
GFR calc Af Amer: >60 mL/min (ref >60)
GFR calc non Af Amer: >60 mL/min (ref >60)
GLUCOSE: 89 mg/dL (ref 65–99)
Potassium: 3.3 mmol/L — ABNORMAL LOW (ref 3.5–5.1)
SODIUM: 136 mmol/L (ref 135–145)
Total Protein: 6.9 g/dL (ref 6.5–8.1)

## 2015-06-19 LAB — CBC
HEMATOCRIT: 33.2 % — AB (ref 36.0–46.0)
Hemoglobin: 11.3 g/dL — ABNORMAL LOW (ref 12.0–15.0)
MCH: 31 pg (ref 26.0–34.0)
MCHC: 34 g/dL (ref 30.0–36.0)
MCV: 91.2 fL (ref 78.0–100.0)
Platelets: 168 10*3/uL (ref 150–400)
RBC: 3.64 MIL/uL — AB (ref 3.87–5.11)
RDW: 13 % (ref 11.5–15.5)
WBC: 9.7 10*3/uL (ref 4.0–10.5)

## 2015-06-19 LAB — URINALYSIS, ROUTINE W REFLEX MICROSCOPIC
Bilirubin Urine: NEGATIVE
Glucose, UA: NEGATIVE mg/dL
HGB URINE DIPSTICK: NEGATIVE
Ketones, ur: >80 mg/dL — AB
LEUKOCYTES UA: NEGATIVE
NITRITE: NEGATIVE
Protein, ur: NEGATIVE mg/dL
SPECIFIC GRAVITY, URINE: 1.026 (ref 1.005–1.030)
UROBILINOGEN UA: 0.2 mg/dL (ref 0.0–1.0)
pH: 5 (ref 5.0–8.0)

## 2015-06-19 LAB — POC URINE PREG, ED: PREG TEST UR: NEGATIVE

## 2015-06-19 LAB — LIPASE, BLOOD: Lipase: 19 U/L — ABNORMAL LOW (ref 22–51)

## 2015-06-19 MED ORDER — METHOCARBAMOL 1000 MG/10ML IJ SOLN
500.0000 mg | Freq: Once | INTRAVENOUS | Status: AC
  Administered 2015-06-19: 500 mg via INTRAVENOUS
  Filled 2015-06-19: qty 5

## 2015-06-19 MED ORDER — SODIUM CHLORIDE 0.9 % IV BOLUS (SEPSIS)
1000.0000 mL | Freq: Once | INTRAVENOUS | Status: AC
  Administered 2015-06-19: 1000 mL via INTRAVENOUS

## 2015-06-19 MED ORDER — ONDANSETRON HCL 4 MG/2ML IJ SOLN
4.0000 mg | Freq: Once | INTRAMUSCULAR | Status: AC
  Administered 2015-06-19: 4 mg via INTRAVENOUS
  Filled 2015-06-19: qty 2

## 2015-06-19 MED ORDER — ORPHENADRINE CITRATE 30 MG/ML IJ SOLN: 60.0000 mg | Freq: Two times a day (BID) | INTRAMUSCULAR | Status: DC

## 2015-06-19 NOTE — ED Notes (Signed)
Pt. reports generalized abdominal pain with emesis and diarrhea onset today , denies fever or chills, no urinary discomfort .

## 2015-06-19 NOTE — ED Notes (Signed)
Called pt  twice for triage room placement  with no answer

## 2015-06-19 NOTE — ED Provider Notes (Signed)
CSN: [id number]     Arrival date & time 06/19/15  2017 History   First MD Initiated Contact with Patient 06/19/15 2217     Chief Complaint  Patient presents with  . Abdominal Pain     (Consider location/radiation/quality/duration/timing/severity/associated sxs/prior Treatment) Patient is a 34 y.o. female presenting with abdominal pain.  Abdominal Pain Pain location:  Periumbilical and epigastric Pain quality: pressure   Pain radiates to:  RUQ Pain severity:  Mild Duration:  4 hours Timing:  Constant Progression:  Improving Chronicity:  New Relieved by:  None tried Worsened by:  Eating Ineffective treatments:  None tried Associated symptoms: anorexia (low appetite), chills, diarrhea (twice), nausea and vomiting   Associated symptoms: no chest pain, no constipation, no cough, no dysuria, no fatigue, no fever, no shortness of breath, no sore throat, no vaginal bleeding and no vaginal discharge   Risk factors: NSAID use   Risk factors: has not had multiple surgeries     Past Medical History  Diagnosis Date  . Back pain   . Anemia    History reviewed. No pertinent past surgical history. No family history on file. Social History  Substance Use Topics  . Smoking status: Current Every Day Smoker -- 0.00 packs/day    Types: Cigarettes  . Smokeless tobacco: None  . Alcohol Use: Yes     Comment: occ   OB History    Gravida Para Term Preterm AB TAB SAB Ectopic Multiple Living   1    1  1   2      Review of Systems  Constitutional: Positive for chills. Negative for fever and fatigue.  HENT: Negative for sore throat.   Eyes: Negative for visual disturbance.  Respiratory: Negative for cough and shortness of breath.   Cardiovascular: Negative for chest pain.  Gastrointestinal: Positive for nausea, vomiting, abdominal pain, diarrhea (twice) and anorexia (low appetite). Negative for constipation.  Genitourinary: Negative for dysuria, vaginal bleeding, vaginal discharge and  difficulty urinating.  Musculoskeletal: Positive for back pain (12 years). Negative for neck pain.  Skin: Negative for rash.  Neurological: Negative for syncope and headaches.      Allergies  Review of patient's allergies indicates no known allergies.  Home Medications   Prior to Admission medications   Medication Sig Start Date End Date Taking? Authorizing Provider  diclofenac (VOLTAREN) 50 MG EC tablet Take 1 tablet (50 mg total) by mouth 2 (two) times daily as needed. Patient not taking: Reported on 04/11/2015 12/17/13   Rodolph Bong, MD  ibuprofen (ADVIL,MOTRIN) 200 MG tablet Take 200 mg by mouth every 6 (six) hours as needed for headache or mild pain.    Historical Provider, MD  ibuprofen (ADVIL,MOTRIN) 600 MG tablet Take 1 tablet (600 mg total) by mouth every 6 (six) hours as needed. Patient not taking: Reported on 04/11/2015 03/28/14   Marlon Pel, PA-C  methocarbamol (ROBAXIN) 500 MG tablet Take 2 tablets (1,000 mg total) by mouth every 8 (eight) hours as needed for muscle spasms (back pain). Patient not taking: Reported on 04/11/2015 12/17/13   Rodolph Bong, MD  traMADol (ULTRAM) 50 MG tablet Take 1 tablet (50 mg total) by mouth every 6 (six) hours as needed. Patient not taking: Reported on 04/11/2015 12/17/13   Rodolph Bong, MD   BP 105/72 mmHg  Pulse 83  Temp(Src) 98.1 F (36.7 C) (Oral)  Resp 14  Ht 5\' 8"  (1.727 m)  Wt 153 lb (69.4 kg)  BMI 23.27 kg/m2  SpO2 100%  LMP 05/29/2015 Physical Exam  Constitutional: She is oriented to person, place, and time. She appears well-developed and well-nourished. No distress.  HENT:  Head: Normocephalic and atraumatic.  Eyes: Conjunctivae and EOM are normal.  Neck: Normal range of motion.  Cardiovascular: Normal rate, regular rhythm, normal heart sounds and intact distal pulses.  Exam reveals no gallop and no friction rub.   No murmur heard. Pulmonary/Chest: Effort normal and breath sounds normal. No respiratory distress. She has no  wheezes. She has no rales.  Abdominal: Soft. She exhibits no distension. There is no tenderness. There is no guarding and no CVA tenderness.  Musculoskeletal: She exhibits no edema or tenderness.  Neurological: She is alert and oriented to person, place, and time.  Skin: Skin is warm and dry. No rash noted. She is not diaphoretic. No erythema.  Nursing note and vitals reviewed.   ED Course  Procedures (including critical care time) Labs Review Labs Reviewed  LIPASE, BLOOD - Abnormal; Notable for the following:    Lipase 19 (*)    All other components within normal limits  COMPREHENSIVE METABOLIC PANEL - Abnormal; Notable for the following:    Potassium 3.3 (*)    AST 373 (*)    ALT 79 (*)    Total Bilirubin 1.9 (*)    All other components within normal limits  CBC - Abnormal; Notable for the following:    RBC 3.64 (*)    Hemoglobin 11.3 (*)    HCT 33.2 (*)    All other components within normal limits  URINALYSIS, ROUTINE W REFLEX MICROSCOPIC (NOT AT [facility]) - Abnormal; Notable for the following:    Color, Urine AMBER (*)    APPearance CLOUDY (*)    Ketones, ur >80 (*)    All other components within normal limits  PREGNANCY, URINE  BILIRUBIN, DIRECT  POC URINE PREG, ED    Imaging Review US Abdomen Limited Ruq  06/20/2015   CLINICAL DATA:  34 year old female with abdominal pain  EXAM: US ABDOMEN LIMITED - RIGHT UPPER QUADRANT  COMPARISON:  None.  FINDINGS: Gallbladder:  There is diffuse thickening and edema of the gallbladder wall measuring up to 7 mm. No definite echogenic stone identified within the gallbladder. No pericholecystic fluid. Tenderness was elicited over the gallbladder area during scanning. An acalculous cholecystitis or chronic cholecystitis is not excluded.  Common bile duct:  Diameter: 4 mm  Liver:  No focal lesion identified. Within normal limits in parenchymal echogenicity.  IMPRESSION: Diffusely thickened and edematous appearance of the gallbladder wall. No  echogenic stones identified. A hepatobiliary scintigraphy may provide better evaluation of the gallbladder and gallbladder function.   Electronically Signed   By: Elgie Collard M.D.   On: 06/20/2015 02:06   I have personally reviewed and evaluated these images and lab results as part of my medical decision-making.   EKG Interpretation   Date/Time:  Sunday June 19 2015 23:24:11 EDT Ventricular Rate:  52 PR Interval:  167 QRS Duration: 94 QT Interval:  491 QTC Calculation: 457 R Axis:   66 Text Interpretation:  Sinus arrhythmia Confirmed by [facility] MD, Belmira Daley  ([id number]) on 06/20/2015 12:36:12 AM      MDM   Final diagnoses:  Abdominal pain   34 year old female with no significant medical history presents with concern of epigastric abdominal pain, nausea, vomiting.  Patient without abdominal tenderness on my exam, however reports back spasm which she has had intermittently for years. She is given Robaxin and Valium and heating pack with  relief of pain.  Labs returned showing a normal lipase, no sign of urinary tract infection, negative pregnancy test, no white count, however elevation in AST to 373, ALT to 79, bilirubin total 1.9.  Performed right upper quadrant ultrasound which showed diffuse gallbladder wall thickening and edema. Consulted general surgery and spoke with Dr. Lindie Spruce, who recommended admission to medicine with further evaluation, monitoring of labs, possible gastroenterology consult with surgery following.    Alvira Monday, MD 06/20/15 [phone]

## 2015-06-20 ENCOUNTER — Emergency Department (HOSPITAL_COMMUNITY): Payer: Self-pay

## 2015-06-20 ENCOUNTER — Observation Stay (HOSPITAL_COMMUNITY): Payer: Self-pay

## 2015-06-20 ENCOUNTER — Encounter (HOSPITAL_COMMUNITY): Payer: Self-pay | Admitting: *Deleted

## 2015-06-20 DIAGNOSIS — D649 Anemia, unspecified: Secondary | ICD-10-CM | POA: Diagnosis present

## 2015-06-20 DIAGNOSIS — R740 Nonspecific elevation of levels of transaminase and lactic acid dehydrogenase [LDH]: Secondary | ICD-10-CM

## 2015-06-20 DIAGNOSIS — R1010 Upper abdominal pain, unspecified: Secondary | ICD-10-CM

## 2015-06-20 DIAGNOSIS — R7401 Elevation of levels of liver transaminase levels: Secondary | ICD-10-CM | POA: Insufficient documentation

## 2015-06-20 DIAGNOSIS — E876 Hypokalemia: Secondary | ICD-10-CM | POA: Diagnosis present

## 2015-06-20 DIAGNOSIS — R748 Abnormal levels of other serum enzymes: Secondary | ICD-10-CM | POA: Diagnosis present

## 2015-06-20 LAB — RETICULOCYTES
RBC.: 3.68 MIL/uL — AB (ref 3.87–5.11)
RETIC CT PCT: 1.6 % (ref 0.4–3.1)
Retic Count, Absolute: 58.9 10*3/uL (ref 19.0–186.0)

## 2015-06-20 LAB — COMPREHENSIVE METABOLIC PANEL
ALK PHOS: 122 U/L (ref 38–126)
ALT: 128 U/L — AB (ref 14–54)
ANION GAP: 8 (ref 5–15)
AST: 383 U/L — ABNORMAL HIGH (ref 15–41)
Albumin: 3.8 g/dL (ref 3.5–5.0)
BILIRUBIN TOTAL: 2.6 mg/dL — AB (ref 0.3–1.2)
BUN: 7 mg/dL (ref 6–20)
CALCIUM: 8.6 mg/dL — AB (ref 8.9–10.3)
CO2: 23 mmol/L (ref 22–32)
CREATININE: 0.73 mg/dL (ref 0.44–1.00)
Chloride: 105 mmol/L (ref 101–111)
Glucose, Bld: 91 mg/dL (ref 65–99)
Potassium: 4.1 mmol/L (ref 3.5–5.1)
Sodium: 136 mmol/L (ref 135–145)
TOTAL PROTEIN: 6.6 g/dL (ref 6.5–8.1)

## 2015-06-20 LAB — IRON AND TIBC
Iron: 35 ug/dL (ref 28–170)
SATURATION RATIOS: 11 % (ref 10.4–31.8)
TIBC: 305 ug/dL (ref 250–450)
UIBC: 270 ug/dL

## 2015-06-20 LAB — CBC
HCT: 33.4 % — ABNORMAL LOW (ref 36.0–46.0)
HEMOGLOBIN: 11 g/dL — AB (ref 12.0–15.0)
MCH: 29.9 pg (ref 26.0–34.0)
MCHC: 32.9 g/dL (ref 30.0–36.0)
MCV: 90.8 fL (ref 78.0–100.0)
PLATELETS: 179 10*3/uL (ref 150–400)
RBC: 3.68 MIL/uL — AB (ref 3.87–5.11)
RDW: 13 % (ref 11.5–15.5)
WBC: 9.4 10*3/uL (ref 4.0–10.5)

## 2015-06-20 LAB — PREGNANCY, URINE: PREG TEST UR: NEGATIVE

## 2015-06-20 LAB — FERRITIN: FERRITIN: 69 ng/mL (ref 11–307)

## 2015-06-20 LAB — BILIRUBIN, DIRECT: BILIRUBIN DIRECT: 1 mg/dL — AB (ref 0.1–0.5)

## 2015-06-20 LAB — MAGNESIUM: MAGNESIUM: 1.7 mg/dL (ref 1.7–2.4)

## 2015-06-20 LAB — VITAMIN B12: Vitamin B-12: 702 pg/mL (ref 180–914)

## 2015-06-20 MED ORDER — HYDROMORPHONE HCL 1 MG/ML IJ SOLN
1.0000 mg | INTRAMUSCULAR | Status: DC | PRN
  Administered 2015-06-20: 1 mg via INTRAVENOUS
  Filled 2015-06-20: qty 1

## 2015-06-20 MED ORDER — PANTOPRAZOLE SODIUM 40 MG IV SOLR
40.0000 mg | INTRAVENOUS | Status: DC
  Administered 2015-06-20 – 2015-06-21 (×2): 40 mg via INTRAVENOUS
  Filled 2015-06-20 (×3): qty 40

## 2015-06-20 MED ORDER — MAGNESIUM SULFATE 2 GM/50ML IV SOLN
2.0000 g | Freq: Once | INTRAVENOUS | Status: AC
  Administered 2015-06-20: 2 g via INTRAVENOUS
  Filled 2015-06-20: qty 50

## 2015-06-20 MED ORDER — MORPHINE SULFATE (PF) 4 MG/ML IV SOLN
2.7800 mg | Freq: Once | INTRAVENOUS | Status: AC
  Administered 2015-06-20: 2.78 mg via INTRAVENOUS

## 2015-06-20 MED ORDER — CYCLOBENZAPRINE HCL 10 MG PO TABS
10.0000 mg | ORAL_TABLET | Freq: Once | ORAL | Status: AC
Start: 2015-06-20 — End: 2015-06-20
  Administered 2015-06-20: 10 mg via ORAL
  Filled 2015-06-20: qty 1

## 2015-06-20 MED ORDER — ONDANSETRON HCL 4 MG/2ML IJ SOLN: 4.0000 mg | Freq: Four times a day (QID) | INTRAMUSCULAR | Status: DC | PRN

## 2015-06-20 MED ORDER — OXYCODONE HCL 5 MG PO TABS: 5.0000 mg | ORAL_TABLET | ORAL | Status: DC | PRN

## 2015-06-20 MED ORDER — TECHNETIUM TC 99M MEBROFENIN IV KIT
5.0000 | PACK | Freq: Once | INTRAVENOUS | Status: DC | PRN
  Administered 2015-06-20: 5 via INTRAVENOUS
  Filled 2015-06-20: qty 6

## 2015-06-20 MED ORDER — HYDROMORPHONE HCL 1 MG/ML IJ SOLN: 0.5000 mg | INTRAMUSCULAR | Status: DC | PRN

## 2015-06-20 MED ORDER — MORPHINE SULFATE (PF) 4 MG/ML IV SOLN
INTRAVENOUS | Status: AC
  Filled 2015-06-20: qty 1

## 2015-06-20 MED ORDER — HYOSCYAMINE SULFATE 0.125 MG SL SUBL: 0.2500 mg | SUBLINGUAL_TABLET | Freq: Once | SUBLINGUAL | Status: DC

## 2015-06-20 MED ORDER — DIAZEPAM 5 MG/ML IJ SOLN
5.0000 mg | Freq: Once | INTRAMUSCULAR | Status: AC
  Administered 2015-06-20: 5 mg via INTRAVENOUS
  Filled 2015-06-20: qty 2

## 2015-06-20 MED ORDER — GADOBENATE DIMEGLUMINE 529 MG/ML IV SOLN
15.0000 mL | Freq: Once | INTRAVENOUS | Status: AC | PRN
  Administered 2015-06-20: 14 mL via INTRAVENOUS

## 2015-06-20 MED ORDER — NICOTINE 14 MG/24HR TD PT24
14.0000 mg | MEDICATED_PATCH | Freq: Every day | TRANSDERMAL | Status: DC
  Administered 2015-06-20 – 2015-06-21 (×2): 14 mg via TRANSDERMAL
  Filled 2015-06-20 (×2): qty 1

## 2015-06-20 MED ORDER — ONDANSETRON HCL 4 MG/2ML IJ SOLN
4.0000 mg | Freq: Once | INTRAMUSCULAR | Status: AC
  Administered 2015-06-20: 4 mg via INTRAVENOUS
  Filled 2015-06-20: qty 2

## 2015-06-20 MED ORDER — POTASSIUM CHLORIDE IN NACL 20-0.9 MEQ/L-% IV SOLN
INTRAVENOUS | Status: DC
  Administered 2015-06-20 – 2015-06-21 (×2): via INTRAVENOUS
  Filled 2015-06-20 (×4): qty 1000

## 2015-06-20 NOTE — Consult Note (Signed)
Consultation  Referring Provider:      Primary Care Physician:  No PCP Per Patient Primary Gastroenterologist:         Reason for Consultation:              HPI:   Kerri Baldwin is a 34 y.o. female who developed acute nonradiating epigastric pain yesterday morning. She has never had this pain before. Pain persisted for over an hour so patient presented to ED. No chills, arthralgias or other complaints. Patient doesn't have a PCP, rarely sees a healthcare provider. She drinks 2-3 drinks /day but only a couple of times a week. She recently took Robaxin but currently takes only Ibuprofen but not on daily basis. No herbs. No IV drug use. She smokes marijuana.   Past Medical History  Diagnosis Date  . Anemia     PSH:  non  FMH: No liver disease.   Social History  Substance Use Topics  . Smoking status: Current Every Day Smoker -- 0.00 packs/day    Types: Cigarettes  . Smokeless tobacco: None  . Alcohol Use: Yes     Comment: occ    Prior to Admission medications   Medication Sig Start Date End Date Taking? Authorizing Provider  methocarbamol (ROBAXIN) 500 MG tablet Take 2 tablets (1,000 mg total) by mouth every 8 (eight) hours as needed for muscle spasms (back pain). Patient not taking: Reported on 04/11/2015 12/17/13   Rodolph Bong, MD    Current Facility-Administered Medications  Medication Dose Route Frequency Provider Last Rate Last Dose  . 0.9 % NaCl with KCl 20 mEq/ L  infusion   Intravenous Continuous Bobette Mo, MD 125 mL/hr at 06/20/15 0348    . HYDROmorphone (DILAUDID) injection 0.5 mg  0.5 mg Intravenous Q3H PRN Renae Fickle, MD      . magnesium sulfate IVPB 2 g 50 mL  2 g Intravenous Once Bobette Mo, MD   2 g at 06/20/15 0948  . nicotine (NICODERM CQ - dosed in mg/24 hours) patch 14 mg  14 mg Transdermal Daily Bobette Mo, MD   14 mg at 06/20/15 0948  . ondansetron (ZOFRAN) injection 4 mg  4 mg Intravenous Q6H PRN Bobette Mo, MD       . pantoprazole (PROTONIX) injection 40 mg  40 mg Intravenous Q24H Bobette Mo, MD   40 mg at 06/20/15 0415    Allergies as of 06/19/2015  . (No Known Allergies)     Review of Systems:    As per HPI, otherwise negative    Physical Exam:  Vital signs in last 24 hours: Temp:  [98.1 F (36.7 C)-99 F (37.2 C)] 99 F (37.2 C) (09/12 0322) Pulse Rate:  [55-96] 88 (09/12 0322) Resp:  [14-18] 15 (09/12 0322) BP: (102-127)/(55-88) 111/55 mmHg (09/12 0322) SpO2:  [97 %-100 %] 100 % (09/12 0322) Weight:  [153 lb (69.4 kg)] 153 lb (69.4 kg) (09/11 2106) Last BM Date: 06/19/15 General:   Pleasant black female in NAD Head:  Normocephalic and atraumatic. Eyes:   No icterus.   Conjunctiva pink. Ears:  Normal auditory acuity. Neck:  Supple Lungs:  Respirations even and unlabored. Lungs clear to auscultation bilaterally.   No wheezes, crackles, or rhonchi.  Heart:  Regular rate and rhythm; no MRG Abdomen:  Soft, nondistended, nontender. Normal bowel sounds. No appreciable masses or hepatomegaly.  Rectal:  Not performed.  Msk:  Symmetrical without gross deformities.  Extremities:  Without  edema Skin: tattoo -RLQ Neurologic:  Alert and  oriented x4;  grossly normal neurologically. Skin:  Intact without significant lesions or rashes. Psych:  Alert and cooperative. Normal affect.  LAB RESULTS:  Recent Labs  06/19/15 2118 06/20/15 0512  WBC 9.7 9.4  HGB 11.3* 11.0*  HCT 33.2* 33.4*  PLT 168 179   BMET  Recent Labs  06/19/15 2118 06/20/15 0512  NA 136 136  K 3.3* 4.1  CL 105 105  CO2 22 23  GLUCOSE 89 91  BUN 7 7  CREATININE 0.65 0.73  CALCIUM 9.0 8.6*   LFT  Recent Labs  06/20/15 0512  PROT 6.6  ALBUMIN 3.8  AST 383*  ALT 128*  ALKPHOS 122  BILITOT 2.6*  BILIDIR 1.0*     STUDIES: Mr Abd W/wo Cm/mrcp  06/20/2015   CLINICAL DATA:  Patient with acute onset abdominal pain yesterday morning. Nausea. Vomiting. Right upper quadrant pain radiating and  crossing midline.  EXAM: MRI ABDOMEN WITHOUT AND WITH CONTRAST (INCLUDING MRCP)  TECHNIQUE: Multiplanar multisequence MR imaging of the abdomen was performed both before and after the administration of intravenous contrast. Heavily T2-weighted images of the biliary and pancreatic ducts were obtained, and three-dimensional MRCP images were rendered by post processing.  CONTRAST:  14mL MULTIHANCE GADOBENATE DIMEGLUMINE 529 MG/ML IV SOLN  COMPARISON:  Ultrasound abdomen 06/20/2015  FINDINGS: Lower chest:  Unremarkable  Hepatobiliary: Liver is normal in size and contour. No focal hepatic lesion identified. No cholelithiasis. No intrahepatic or extrahepatic biliary ductal dilatation. Minimal amount of nonspecific fluid about the gallbladder. No gallbladder wall thickening.  Pancreas: Unremarkable  Spleen: Unremarkable  Adrenals/Urinary Tract: Normal adrenal glands. Kidneys enhance symmetrically with contrast. No hydronephrosis.  Stomach/Bowel: No abnormal bowel wall thickening or evidence for bowel obstruction.  Vascular/Lymphatic: Normal caliber abdominal aorta. No retroperitoneal lymphadenopathy.  Other: None  Musculoskeletal: Normal osseous marrow signal.  IMPRESSION: Minimal amount of nonspecific fluid about the gallbladder. No gallbladder wall thickening, intrahepatic or extrahepatic biliary ductal dilatation or surrounding inflammatory change involving the right upper quadrant.   Electronically Signed   By: Annia Belt M.D.   On: 06/20/2015 09:57   US Abdomen Limited Ruq  06/20/2015   CLINICAL DATA:  35 year old female with abdominal pain  EXAM: US ABDOMEN LIMITED - RIGHT UPPER QUADRANT  COMPARISON:  None.  FINDINGS: Gallbladder:  There is diffuse thickening and edema of the gallbladder wall measuring up to 7 mm. No definite echogenic stone identified within the gallbladder. No pericholecystic fluid. Tenderness was elicited over the gallbladder area during scanning. An acalculous cholecystitis or chronic  cholecystitis is not excluded.  Common bile duct:  Diameter: 4 mm  Liver:  No focal lesion identified. Within normal limits in parenchymal echogenicity.  IMPRESSION: Diffusely thickened and edematous appearance of the gallbladder wall. No echogenic stones identified. A hepatobiliary scintigraphy may provide better evaluation of the gallbladder and gallbladder function.   Electronically Signed   By: Elgie Collard M.D.   On: 06/20/2015 02:06     PREVIOUS ENDOSCOPIES:            none   Impression / Plan:    34 year old female acute nonradiating epigastric pain, abnormal LFTs in mixed pattern but predominatly hepatocellular. She drinks but not heavily and transaminases higher than expected for ETOH. U/S suggests cholecystitis but MRI findings don't correlate.  No gallstones or biliary duct dilation on u/s nor MRCP. Takes Motrin as needed, no other medications. No longer taking muscle relaxer. Based on history  she gives this doesn't seem to be drug induced. Will obtain viral hepatitis studies, ANA, ASMA if not already ordered. Monitor closely, LFTs went up overnight. Repeat LFTs in am.    Thanks     Willette Cluster  06/20/2015, 10:18 AM    Attending Addendum: I have taken an interval history, reviewed the chart, and examined the patient. I agree with the Advanced Practitioner's note and impression. RUQ US showed some changes initially concerning for cholecystitis but HIDA is normal. MRCP done and shows no dilation of ducts and no GB thickening, only mild fluid around the GB. She had severe pain yesterday which has since resolved as of this morning, she feels fine at present and tolerating a regular diet. AST > ALT elevation noted with mild elevation of bilirubin. Agree with viral workup and labs to evaluate for transaminitis, although certainly possible she had a gallstone in the duct and it passed given how she describes her pain and now it has resolved. No risk factors for PUD and this would seem  unlikely to resolve without therapy so quickly. For now, will repeat labs in the AM. If AST/ALT continue to rise or her pain recurs can expand lab workup and consider further imaging (CT abdomen) to evaluate for for other causes of her pain.   Ileene Patrick, MD [facility] Pager [phone]

## 2015-06-20 NOTE — Progress Notes (Signed)
   06/20/15 1650  Clinical Encounter Type  Visited With Patient;Health care provider  Visit Type Initial   Chaplain responded to a request for an Advanced Directive, and the patient does not want one and was confused about the consult. Chaplain support available as needed. Pager - [phone]  Alda Ponder, Chaplain 06/20/2015 4:51 PM

## 2015-06-20 NOTE — Progress Notes (Signed)
[facility] PROGRESS NOTE  Kerri Baldwin ZOX:[id number] DOB: 05-Dec-1980 DOA: 06/19/2015 PCP: No PCP Per Patient  Brief Summary  The patient is a 34 year old female with history of anemia and back pain who presented to the emergency department with epigastric and right upper pain. She had had nausea with vomiting and diarrhea. She had been at a party tonight's prior to admission and had drank 2 cocktails and one beer. She felt fine following morning, but that evening, she developed abdominal pain that progressed to 10 out of 10 pain. She denied exposure to anyone with diarrhea, IV drug use, Tylenol overdose or use. She has been sexually active over the last few months. She does not know if there is a family history of any autoimmune diseases or liver problems.  Assessment/Plan  Transaminitis, worsening, possibly due to alcohol consumption on Saturday, however she states did not have that much to drink.  The 3 to 1 ratio suggestive of alcohol.  It is possible that she passed a gallstone, however her abdominal ultrasound did not demonstrate any evidence of gallstone, neither did her follow-up MRCP. She had a negative HIDA scan. General surgery has signed off. Abdominal pain is improving. -  Acute hepatitis panel pending -  Anti-nuclear antibody pending -  Anti-smooth muscle antibody pending -  Advanced to regular diet -  Continue antiemetics and pain medications as needed  -  Repeat liver function test in a.m.  Hypokalemia, resolved with replacement.  Magnesium 1.7.  Normocytic anemia, iron studies within normal limits, vitamin B12 702, folate, TSH pending -  Consider hemoglobin electrophoresis to check for sickle cell trait  Diet:  Regular  Access:  PIV  IVF:  Yes  Proph:  Lovenox  Code Status: Full code  Family Communication: Patient alone  Disposition Plan: Pending improvement in liver function tests  Consultants: General surgery Gastroenterology  Procedures: Abdominal  ultrasound MRCP HIDA scan  Antibiotics:  Negative   HPI/Subjective:  States that her abdominal pain has improved and she no longer feels 6 to her stomach. No recent diarrhea. She would like to eat.  Objective: Filed Vitals:   06/20/15 0115 06/20/15 0130 06/20/15 0322 06/20/15 1500  BP: 111/65 105/72 111/55 104/63  Pulse: 81 83 88 92  Temp:   99 F (37.2 C) 98.9 F (37.2 C)  TempSrc:   Oral   Resp: 14 14 15 16   Height:      Weight:      SpO2: 100% 100% 100% 100%    Intake/Output Summary (Last 24 hours) at 06/20/15 1538 Last data filed at 06/20/15 1500  Gross per 24 hour  Intake   1000 ml  Output      0 ml  Net   1000 ml   Filed Weights   06/19/15 2106  Weight: 69.4 kg (153 lb)   Body mass index is 23.27 kg/(m^2).  Exam:   General:   adult female, No acute distress  HEENT:  NCAT, MMM  Cardiovascular:  RRR, nl S1, S2 no mrg, 2+ pulses, warm extremities  Respiratory:  CTAB, no increased WOB  Abdomen:   NABS, soft, NT/ND  MSK:   Normal tone and bulk, no LEE  Neuro:  Grossly intact  Data Reviewed: Basic Metabolic Panel:  Recent Labs Lab 06/19/15 2118 06/20/15 0512  NA 136 136  K 3.3* 4.1  CL 105 105  CO2 22 23  GLUCOSE 89 91  BUN 7 7  CREATININE 0.65 0.73  CALCIUM 9.0 8.6*  MG  --  1.7   Liver Function Tests:  Recent Labs Lab 06/19/15 2118 06/20/15 0512  AST 373* 383*  ALT 79* 128*  ALKPHOS 96 122  BILITOT 1.9* 2.6*  PROT 6.9 6.6  ALBUMIN 4.1 3.8    Recent Labs Lab 06/19/15 2118  LIPASE 19*   No results for input(s): AMMONIA in the last 168 hours. CBC:  Recent Labs Lab 06/19/15 2118 06/20/15 0512  WBC 9.7 9.4  HGB 11.3* 11.0*  HCT 33.2* 33.4*  MCV 91.2 90.8  PLT 168 179    No results found for this or any previous visit (from the past 240 hour(s)).   Studies: Nm Hepatobiliary Liver Func  06/20/2015   CLINICAL DATA:  Abdominal pain with progressive elevated liver enzymes. Gallbladder demonstrating edematous wall   EXAM: NUCLEAR MEDICINE HEPATOBILIARY IMAGING  TECHNIQUE: Sequential images of the abdomen were obtained out to 60 minutes following intravenous administration of radiopharmaceutical.  Views:  Anterior, right lateral right upper quadrant  Radionuclide:  Technetium 37m Choletec  Dose:  5.0 mCi  Route of administration: Intravenous  COMPARISON:  June 20, 2015 ultrasound right upper quadrant; abdominal MRI June 20, 2015.  FINDINGS: Note that 1.78 mg of morphine was administered intravenously to facilitate gallbladder visualization.  Liver uptake of radiotracer is normal. There is visualization of gallbladder and small bowel, indicating patency of the cystic and common bile ducts.  IMPRESSION: Gallbladder and small bowel visualize, indicating patency of the cystic and common bile ducts.   Electronically Signed   By: Bretta Bang III M.D.   On: 06/20/2015 14:37   Mr Roe Coombs W/wo Cm/mrcp  06/20/2015   CLINICAL DATA:  Patient with acute onset abdominal pain yesterday morning. Nausea. Vomiting. Right upper quadrant pain radiating and crossing midline.  EXAM: MRI ABDOMEN WITHOUT AND WITH CONTRAST (INCLUDING MRCP)  TECHNIQUE: Multiplanar multisequence MR imaging of the abdomen was performed both before and after the administration of intravenous contrast. Heavily T2-weighted images of the biliary and pancreatic ducts were obtained, and three-dimensional MRCP images were rendered by post processing.  CONTRAST:  14mL MULTIHANCE GADOBENATE DIMEGLUMINE 529 MG/ML IV SOLN  COMPARISON:  Ultrasound abdomen 06/20/2015  FINDINGS: Lower chest:  Unremarkable  Hepatobiliary: Liver is normal in size and contour. No focal hepatic lesion identified. No cholelithiasis. No intrahepatic or extrahepatic biliary ductal dilatation. Minimal amount of nonspecific fluid about the gallbladder. No gallbladder wall thickening.  Pancreas: Unremarkable  Spleen: Unremarkable  Adrenals/Urinary Tract: Normal adrenal glands. Kidneys enhance  symmetrically with contrast. No hydronephrosis.  Stomach/Bowel: No abnormal bowel wall thickening or evidence for bowel obstruction.  Vascular/Lymphatic: Normal caliber abdominal aorta. No retroperitoneal lymphadenopathy.  Other: None  Musculoskeletal: Normal osseous marrow signal.  IMPRESSION: Minimal amount of nonspecific fluid about the gallbladder. No gallbladder wall thickening, intrahepatic or extrahepatic biliary ductal dilatation or surrounding inflammatory change involving the right upper quadrant.   Electronically Signed   By: Annia Belt M.D.   On: 06/20/2015 09:57   US Abdomen Limited Ruq  06/20/2015   CLINICAL DATA:  34 year old female with abdominal pain  EXAM: US ABDOMEN LIMITED - RIGHT UPPER QUADRANT  COMPARISON:  None.  FINDINGS: Gallbladder:  There is diffuse thickening and edema of the gallbladder wall measuring up to 7 mm. No definite echogenic stone identified within the gallbladder. No pericholecystic fluid. Tenderness was elicited over the gallbladder area during scanning. An acalculous cholecystitis or chronic cholecystitis is not excluded.  Common bile duct:  Diameter: 4 mm  Liver:  No focal lesion identified. Within normal limits  in parenchymal echogenicity.  IMPRESSION: Diffusely thickened and edematous appearance of the gallbladder wall. No echogenic stones identified. A hepatobiliary scintigraphy may provide better evaluation of the gallbladder and gallbladder function.   Electronically Signed   By: Elgie Collard M.D.   On: 06/20/2015 02:06    Scheduled Meds: . morphine      . nicotine  14 mg Transdermal Daily  . pantoprazole (PROTONIX) IV  40 mg Intravenous Q24H   Continuous Infusions: . 0.9 % NaCl with KCl 20 mEq / L 125 mL/hr at 06/20/15 0348    Principal Problem:   Abnormal transaminases Active Problems:   Hypokalemia   Anemia    Time spent: 30 min    Sophiah Rolin, [facility]  [facility] Pager [phone]. If 7PM-7AM, please contact night-coverage at  [email or web address], password [facility] 06/20/2015, 3:38 PM

## 2015-06-20 NOTE — H&P (Signed)
[facility] History and Physical  Kerri Baldwin ZOX:[id number] DOB: 10/14/1980 DOA: 06/19/2015  Referring physician: Alvira Monday, MD PCP: No PCP Per Patient   Chief Complaint: Abdominal Pain.  HPI: Kerri Baldwin is a 34 y.o. female with past medical history of anemia and back pain who comes to the emergency department due to epigastric/right upper quadrant pain since earlier this evening associated with nausea, emesis 6, diarrhea 3. Per patient, she was at a social gathering Saturday night, had a beer and 2 cocktails. She denies having any discomfort at that time, subsequently went home to sleep, woke up in the morning and ate bacon and eggs for breakfast. She states that she did not have any symptoms after that, but several hours later in the evening she first started having abdominal pain, which was subsequently followed by the above-mentioned symptoms. She rates the pain at its worst having 9-10 out of 10. She denies heavy drinking or exposure to hepatitis. She denies fever, chills or malaise. She denies urinary symptoms. She denies a use of prescription drugs and only uses over-the-counter ibuprofen on occasion.  When seen in the emergency department, she stated that her pain was better after being medicated. And her workup reveals elevated bilirubin and transaminase. She is currently in no acute distress.  Review of Systems:  Constitutional:  No weight loss, night sweats, Fevers, chills, fatigue.  HEENT:  No headaches, Difficulty swallowing,Tooth/dental problems,Sore throat,  No sneezing, itching, ear ache, nasal congestion, post nasal drip,  Cardio-vascular:  No chest pain, Orthopnea, PND, swelling in lower extremities, anasarca, dizziness, palpitations  GI:  Positive abdominal pain, nausea, vomiting, diarrhea, loss of appetite   Resp:  No shortness of breath with exertion or at rest. No excess mucus, no productive cough, No non-productive cough, No coughing up of  blood.No change in color of mucus.No wheezing.No chest wall deformity  Skin:  no rash or lesions.  GU:  no dysuria, change in color of urine, no urgency or frequency. No flank pain.  Musculoskeletal:  No joint pain or swelling. No decreased range of motion. No back pain.  Psych:  No change in mood or affect. No depression or anxiety. No memory loss.   Past Medical History  Diagnosis Date  . Back pain   . Anemia    History reviewed. No pertinent past surgical history. Social History:  reports that she has been smoking Cigarettes.  She has been smoking about 0.00 packs per day. She does not have any smokeless tobacco history on file. She reports that she drinks alcohol. She reports that she uses illicit drugs (Marijuana) about 7 times per week.  No Known Allergies  History reviewed. No pertinent family history.  Multiple family and extended family members with history of unspecified cancers.   Prior to Admission medications   Medication Sig Start Date End Date Taking? Authorizing Provider  ibuprofen (ADVIL,MOTRIN) 600 MG tablet Take 1 tablet (600 mg total) by mouth every 6 (six) hours as needed. 03/28/14  Yes Tiffany Neva Seat, PA-C  diclofenac (VOLTAREN) 50 MG EC tablet Take 1 tablet (50 mg total) by mouth 2 (two) times daily as needed. Patient not taking: Reported on 04/11/2015 12/17/13   Rodolph Bong, MD  methocarbamol (ROBAXIN) 500 MG tablet Take 2 tablets (1,000 mg total) by mouth every 8 (eight) hours as needed for muscle spasms (back pain). Patient not taking: Reported on 04/11/2015 12/17/13   Rodolph Bong, MD  traMADol (ULTRAM) 50 MG tablet Take 1 tablet (50 mg  total) by mouth every 6 (six) hours as needed. Patient not taking: Reported on 04/11/2015 12/17/13   Rodolph Bong, MD   Physical Exam: Filed Vitals:   06/20/15 0026 06/20/15 0115 06/20/15 0130 06/20/15 0322  BP:  111/65 105/72 111/55  Pulse:  81 83 88  Temp: 98.1 F (36.7 C)   99 F (37.2 C)  TempSrc:    Oral  Resp:  Height:      Weight:      SpO2:  100% 100% 100%    Wt Readings from Last 3 Encounters:  06/19/15 69.4 kg (153 lb)  06/07/13 76.204 kg (168 lb)    General:  Appears calm and comfortable Eyes: PERRL, normal lids, irises & conjunctiva ENT: grossly normal hearing, lips & tongue Neck: no LAD, masses or thyromegaly Cardiovascular: RRR, no m/r/g. No LE edema. Telemetry: SR, no arrhythmias  Respiratory: CTA bilaterally, no w/r/r. Normal respiratory effort. Abdomen: Bowel sounds are positive, soft, positive epigastric/RUQ tenderness. No guarding or rebound tenderness. Skin: no rash or induration seen on limited exam Musculoskeletal: grossly normal tone BUE/BLE Psychiatric: grossly normal mood and affect, speech fluent and appropriate Neurologic: grossly non-focal.          Labs on Admission:  Basic Metabolic Panel:  Recent Labs Lab 06/19/15 2118  NA 136  K 3.3*  CL 105  CO2 22  GLUCOSE 89  BUN 7  CREATININE 0.65  CALCIUM 9.0   Liver Function Tests:  Recent Labs Lab 06/19/15 2118  AST 373*  ALT 79*  ALKPHOS 96  BILITOT 1.9*  PROT 6.9  ALBUMIN 4.1    Recent Labs Lab 06/19/15 2118  LIPASE 19*   CBC:  Recent Labs Lab 06/19/15 2118  WBC 9.7  HGB 11.3*  HCT 33.2*  MCV 91.2  PLT 168    Radiological Exams on Admission: US Abdomen Limited Ruq  06/20/2015   CLINICAL DATA:  34 year old female with abdominal pain  EXAM: US ABDOMEN LIMITED - RIGHT UPPER QUADRANT  COMPARISON:  None.  FINDINGS: Gallbladder:  There is diffuse thickening and edema of the gallbladder wall measuring up to 7 mm. No definite echogenic stone identified within the gallbladder. No pericholecystic fluid. Tenderness was elicited over the gallbladder area during scanning. An acalculous cholecystitis or chronic cholecystitis is not excluded.  Common bile duct:  Diameter: 4 mm  Liver:  No focal lesion identified. Within normal limits in parenchymal echogenicity.  IMPRESSION: Diffusely thickened  and edematous appearance of the gallbladder wall. No echogenic stones identified. A hepatobiliary scintigraphy may provide better evaluation of the gallbladder and gallbladder function.   Electronically Signed   By: Elgie Collard M.D.   On: 06/20/2015 02:06    EKG: Independently reviewed.  Vent. rate 52 BPM PR interval 167 ms QRS duration 94 ms QT/QTc 491/457 ms P-R-T axes 56 66 65 Sinus Arrhythmia.  Assessment/Plan Principal Problem:   Abnormal transaminases Likely due to alcohol consumption on Saturday. Keep nothing by mouth and continue IV fluids. Check acute hepatitis panel. Repeat liver function tests in the morning. Check HIDA scan. Follow-up general surgery consult.  Active Problems:   Hypokalemia Continue replacement and follow-up level. Check magnesium level.    Anemia Check anemia workup and monitor H&H.  General surgery was consulted by the emergency department.  Code Status: Full Code. DVT Prophylaxis: SCDs Family Communication:  Disposition Plan: Admit for observation, symptoms treatment and further evaluation.  Time spent: Over 70 minutes were spent during this admission.  Bobette Mo [facility] Pager [phone].

## 2015-06-20 NOTE — Progress Notes (Signed)
Patient ID: Kerri Baldwin, female   DOB: [birth year], 34 y.o.   MRN: [id number]     [facility]      [address] [place], [place] [address]    Phone: [phone] FAX: [phone]     Subjective: Pain nearly resolved.  LFTs up T bili 2.6.  No n/v.   Objective:  Vital signs:  Filed Vitals:   06/20/15 0026 06/20/15 0115 06/20/15 0130 06/20/15 0322  BP:  111/65 105/72 111/55  Pulse:  81 83 88  Temp: 98.1 F (36.7 C)   99 F (37.2 C)  TempSrc:    Oral  Resp:  _0 Height:      Weight:      SpO2:  100% 100% 100%    Last BM Date: 06/19/15  Intake/Output   Yesterday:  09/11 0701 - 09/12 0700 In: 1000 [I.V.:1000] Out: -  This shift:    I/O last 3 completed shifts: In: 1000 [I.V.:1000] Out: -     Physical Exam: General: Pt awake/alert/oriented x4 in no acute distress Abdomen: Soft.  Nondistended.   Non tender  No evidence of peritonitis.  No incarcerated hernias.    Problem List:   Principal Problem:   Abnormal transaminases Active Problems:   Hypokalemia   Anemia    Results:   Labs: Results for orders placed or performed during the hospital encounter of 06/19/15 (from the past 48 hour(s))  Lipase, blood     Status: Abnormal   Collection Time: 06/19/15  9:18 PM  Result Value Ref Range   Lipase 19 (L) 22 - 51 U/L  Comprehensive metabolic panel     Status: Abnormal   Collection Time: 06/19/15  9:18 PM  Result Value Ref Range   Sodium 136 135 - 145 mmol/L   Potassium 3.3 (L) 3.5 - 5.1 mmol/L   Chloride 105 101 - 111 mmol/L   CO2 22 22 - 32 mmol/L   Glucose, Bld 89 65 - 99 mg/dL   BUN 7 6 - 20 mg/dL   Creatinine, Ser 0.65 0.44 - 1.00 mg/dL   Calcium 9.0 8.9 - 10.3 mg/dL   Total Protein 6.9 6.5 - 8.1 g/dL   Albumin 4.1 3.5 - 5.0 g/dL   AST 373 (H) 15 - 41 U/L   ALT 79 (H) 14 - 54 U/L   Alkaline Phosphatase 96 38 - 126 U/L   Total Bilirubin 1.9 (H) 0.3 - 1.2 mg/dL   GFR calc non Af Amer >60 >60  mL/min   GFR calc Af Amer >60 >60 mL/min    Comment: (NOTE) The eGFR has been calculated using the CKD EPI equation. This calculation has not been validated in all clinical situations. eGFR's persistently <60 mL/min signify possible Chronic Kidney Disease.    Anion gap 9 5 - 15  CBC     Status: Abnormal   Collection Time: 06/19/15  9:18 PM  Result Value Ref Range   WBC 9.7 4.0 - 10.5 K/uL   RBC 3.64 (L) 3.87 - 5.11 MIL/uL   Hemoglobin 11.3 (L) 12.0 - 15.0 g/dL   HCT 33.2 (L) 36.0 - 46.0 %   MCV 91.2 78.0 - 100.0 fL   MCH 31.0 26.0 - 34.0 pg   MCHC 34.0 30.0 - 36.0 g/dL   RDW 13.0 11.5 - 15.5 %   Platelets 168 150 - 400 K/uL  Urinalysis, Routine w reflex microscopic (not at [facility])     Status: Abnormal  Collection Time: 06/19/15 10:15 PM  Result Value Ref Range   Color, Urine AMBER (A) YELLOW    Comment: BIOCHEMICALS MAY BE AFFECTED BY COLOR   APPearance CLOUDY (A) CLEAR   Specific Gravity, Urine 1.026 1.005 - 1.030   pH 5.0 5.0 - 8.0   Glucose, UA NEGATIVE NEGATIVE mg/dL   Hgb urine dipstick NEGATIVE NEGATIVE   Bilirubin Urine NEGATIVE NEGATIVE   Ketones, ur >80 (A) NEGATIVE mg/dL   Protein, ur NEGATIVE NEGATIVE mg/dL   Urobilinogen, UA 0.2 0.0 - 1.0 mg/dL   Nitrite NEGATIVE NEGATIVE   Leukocytes, UA NEGATIVE NEGATIVE    Comment: MICROSCOPIC NOT DONE ON URINES WITH NEGATIVE PROTEIN, BLOOD, LEUKOCYTES, NITRITE, OR GLUCOSE <1000 mg/dL.  Pregnancy, urine     Status: None   Collection Time: 06/19/15 10:15 PM  Result Value Ref Range   Preg Test, Ur NEGATIVE NEGATIVE    Comment:        THE SENSITIVITY OF THIS METHODOLOGY IS >20 mIU/mL.   POC urine preg, ED (not at [facility])     Status: None   Collection Time: 06/19/15 10:26 PM  Result Value Ref Range   Preg Test, Ur NEGATIVE NEGATIVE    Comment:        THE SENSITIVITY OF THIS METHODOLOGY IS >24 mIU/mL   Bilirubin, direct     Status: Abnormal   Collection Time: 06/20/15  5:12 AM  Result Value Ref Range   Bilirubin, Direct  1.0 (H) 0.1 - 0.5 mg/dL  Comprehensive metabolic panel     Status: Abnormal   Collection Time: 06/20/15  5:12 AM  Result Value Ref Range   Sodium 136 135 - 145 mmol/L   Potassium 4.1 3.5 - 5.1 mmol/L    Comment: DELTA CHECK NOTED   Chloride 105 101 - 111 mmol/L   CO2 23 22 - 32 mmol/L   Glucose, Bld 91 65 - 99 mg/dL   BUN 7 6 - 20 mg/dL   Creatinine, Ser 0.73 0.44 - 1.00 mg/dL   Calcium 8.6 (L) 8.9 - 10.3 mg/dL   Total Protein 6.6 6.5 - 8.1 g/dL   Albumin 3.8 3.5 - 5.0 g/dL   AST 383 (H) 15 - 41 U/L   ALT 128 (H) 14 - 54 U/L   Alkaline Phosphatase 122 38 - 126 U/L   Total Bilirubin 2.6 (H) 0.3 - 1.2 mg/dL   GFR calc non Af Amer >60 >60 mL/min   GFR calc Af Amer >60 >60 mL/min    Comment: (NOTE) The eGFR has been calculated using the CKD EPI equation. This calculation has not been validated in all clinical situations. eGFR's persistently <60 mL/min signify possible Chronic Kidney Disease.    Anion gap 8 5 - 15  CBC     Status: Abnormal   Collection Time: 06/20/15  5:12 AM  Result Value Ref Range   WBC 9.4 4.0 - 10.5 K/uL   RBC 3.68 (L) 3.87 - 5.11 MIL/uL   Hemoglobin 11.0 (L) 12.0 - 15.0 g/dL   HCT 33.4 (L) 36.0 - 46.0 %   MCV 90.8 78.0 - 100.0 fL   MCH 29.9 26.0 - 34.0 pg   MCHC 32.9 30.0 - 36.0 g/dL   RDW 13.0 11.5 - 15.5 %   Platelets 179 150 - 400 K/uL  Magnesium     Status: None   Collection Time: 06/20/15  5:12 AM  Result Value Ref Range   Magnesium 1.7 1.7 - 2.4 mg/dL  Ferritin  Status: None   Collection Time: 06/20/15  5:12 AM  Result Value Ref Range   Ferritin 69 11 - 307 ng/mL  Iron and TIBC     Status: None   Collection Time: 06/20/15  5:12 AM  Result Value Ref Range   Iron 35 28 - 170 ug/dL   TIBC 305 250 - 450 ug/dL   Saturation Ratios 11 10.4 - 31.8 %   UIBC 270 ug/dL  Vitamin B12     Status: None   Collection Time: 06/20/15  5:12 AM  Result Value Ref Range   Vitamin B-12 702 180 - 914 pg/mL    Comment: (NOTE) This assay is not validated for  testing neonatal or myeloproliferative syndrome specimens for Vitamin B12 levels.   Reticulocytes     Status: Abnormal   Collection Time: 06/20/15  5:12 AM  Result Value Ref Range   Retic Ct Pct 1.6 0.4 - 3.1 %   RBC. 3.68 (L) 3.87 - 5.11 MIL/uL   Retic Count, Manual 58.9 19.0 - 186.0 K/uL    Imaging / Studies: Mr Abd W/wo Cm/mrcp  06/20/2015   CLINICAL DATA:  Patient with acute onset abdominal pain yesterday morning. Nausea. Vomiting. Right upper quadrant pain radiating and crossing midline.  EXAM: MRI ABDOMEN WITHOUT AND WITH CONTRAST (INCLUDING MRCP)  TECHNIQUE: Multiplanar multisequence MR imaging of the abdomen was performed both before and after the administration of intravenous contrast. Heavily T2-weighted images of the biliary and pancreatic ducts were obtained, and three-dimensional MRCP images were rendered by post processing.  CONTRAST:  69m MULTIHANCE GADOBENATE DIMEGLUMINE 529 MG/ML IV SOLN  COMPARISON:  Ultrasound abdomen 06/20/2015  FINDINGS: Lower chest:  Unremarkable  Hepatobiliary: Liver is normal in size and contour. No focal hepatic lesion identified. No cholelithiasis. No intrahepatic or extrahepatic biliary ductal dilatation. Minimal amount of nonspecific fluid about the gallbladder. No gallbladder wall thickening.  Pancreas: Unremarkable  Spleen: Unremarkable  Adrenals/Urinary Tract: Normal adrenal glands. Kidneys enhance symmetrically with contrast. No hydronephrosis.  Stomach/Bowel: No abnormal bowel wall thickening or evidence for bowel obstruction.  Vascular/Lymphatic: Normal caliber abdominal aorta. No retroperitoneal lymphadenopathy.  Other: None  Musculoskeletal: Normal osseous marrow signal.  IMPRESSION: Minimal amount of nonspecific fluid about the gallbladder. No gallbladder wall thickening, intrahepatic or extrahepatic biliary ductal dilatation or surrounding inflammatory change involving the right upper quadrant.   Electronically Signed   By: DLovey NewcomerM.D.   On:  06/20/2015 09:57   [place]dom[place] Limited Ruq  06/20/2015   CLINICAL DATA:  34year old female with abdominal pain  EXAM: [place]DOMEN LIMITED - RIGHT UPPER QUADRANT  COMPARISON:  None.  FINDINGS: Gallbladder:  There is diffuse thickening and edema of the gallbladder wall measuring up to 7 mm. No definite echogenic stone identified within the gallbladder. No pericholecystic fluid. Tenderness was elicited over the gallbladder area during scanning. An acalculous cholecystitis or chronic cholecystitis is not excluded.  Common bile duct:  Diameter: 4 mm  Liver:  No focal lesion identified. Within normal limits in parenchymal echogenicity.  IMPRESSION: Diffusely thickened and edematous appearance of the gallbladder wall. No echogenic stones identified. A hepatobiliary scintigraphy may provide better evaluation of the gallbladder and gallbladder function.   Electronically Signed   By: AAnner CreteM.D.   On: 06/20/2015 02:06    Medications / Allergies:  Scheduled Meds: . magnesium sulfate 1 - 4 g bolus IVPB  2 g Intravenous Once  . nicotine  14 mg Transdermal Daily  . pantoprazole (PROTONIX) IV  40  mg Intravenous Q24H   Continuous Infusions: . 0.9 % NaCl with KCl 20 mEq / L 125 mL/hr at 06/20/15 0348   PRN Meds:.HYDROmorphone (DILAUDID) injection, ondansetron (ZOFRAN) IV  Antibiotics: Anti-infectives    None        Assessment/Plan Abdominal pain transaminitis Hyperbilirubinemia   MRCP without wall thickening, no CBD dilatation or cholelithiasis.  Doubt source of LFTs and pain is from her gallbladder. Appears more consistent with a hepatitis.  Panel is pending.  I have discussed with Dr. Sheran Fava, plans to call GI.  We will sign off at this time, please call for further assistance.    Erby Pian, [facility] Pager [phone]) For consults and floor pages call [phone])  06/20/2015 10:24 AM

## 2015-06-20 NOTE — Consult Note (Signed)
Reason for Consult:  Abnormal LFT's, abdominal pain, Abnormal ultrasound Referring Physician:  Dr. Alma Friendly Pranger is an 34 y.o. female.  HPI: Acute onset of abdominal pain yesterday AM associated with nausea and ultimately vomiting x 1.  No changes in bowels or urine.  No fevers, but did have chills.  Patient states the pain ws in the RUQ and radiated across the midline to the left side, no back pain.  No lower abdominal pain.  Past Medical History  Diagnosis Date  . Back pain   . Anemia     History reviewed. No pertinent past surgical history.  History reviewed. No pertinent family history.  Social History:  reports that she has been smoking Cigarettes.  She has been smoking about 0.00 packs per day. She does not have any smokeless tobacco history on file. She reports that she drinks alcohol. She reports that she uses illicit drugs (Marijuana) about 7 times per week.  Allergies: No Known Allergies  Medications: The patient reports no medications  Results for orders placed or performed during the hospital encounter of 06/19/15 (from the past 48 hour(s))  Lipase, blood     Status: Abnormal   Collection Time: 06/19/15  9:18 PM  Result Value Ref Range   Lipase 19 (L) 22 - 51 U/L  Comprehensive metabolic panel     Status: Abnormal   Collection Time: 06/19/15  9:18 PM  Result Value Ref Range   Sodium 136 135 - 145 mmol/L   Potassium 3.3 (L) 3.5 - 5.1 mmol/L   Chloride 105 101 - 111 mmol/L   CO2 22 22 - 32 mmol/L   Glucose, Bld 89 65 - 99 mg/dL   BUN 7 6 - 20 mg/dL   Creatinine, Ser 0.65 0.44 - 1.00 mg/dL   Calcium 9.0 8.9 - 10.3 mg/dL   Total Protein 6.9 6.5 - 8.1 g/dL   Albumin 4.1 3.5 - 5.0 g/dL   AST 373 (H) 15 - 41 U/L   ALT 79 (H) 14 - 54 U/L   Alkaline Phosphatase 96 38 - 126 U/L   Total Bilirubin 1.9 (H) 0.3 - 1.2 mg/dL   GFR calc non Af Amer >60 >60 mL/min   GFR calc Af Amer >60 >60 mL/min    Comment: (NOTE) The eGFR has been calculated using the CKD EPI  equation. This calculation has not been validated in all clinical situations. eGFR's persistently <60 mL/min signify possible Chronic Kidney Disease.    Anion gap 9 5 - 15  CBC     Status: Abnormal   Collection Time: 06/19/15  9:18 PM  Result Value Ref Range   WBC 9.7 4.0 - 10.5 K/uL   RBC 3.64 (L) 3.87 - 5.11 MIL/uL   Hemoglobin 11.3 (L) 12.0 - 15.0 g/dL   HCT 33.2 (L) 36.0 - 46.0 %   MCV 91.2 78.0 - 100.0 fL   MCH 31.0 26.0 - 34.0 pg   MCHC 34.0 30.0 - 36.0 g/dL   RDW 13.0 11.5 - 15.5 %   Platelets 168 150 - 400 K/uL  Urinalysis, Routine w reflex microscopic (not at [facility])     Status: Abnormal   Collection Time: 06/19/15 10:15 PM  Result Value Ref Range   Color, Urine AMBER (A) YELLOW    Comment: BIOCHEMICALS MAY BE AFFECTED BY COLOR   APPearance CLOUDY (A) CLEAR   Specific Gravity, Urine 1.026 1.005 - 1.030   pH 5.0 5.0 - 8.0   Glucose, UA NEGATIVE NEGATIVE mg/dL  Hgb urine dipstick NEGATIVE NEGATIVE   Bilirubin Urine NEGATIVE NEGATIVE   Ketones, ur >80 (A) NEGATIVE mg/dL   Protein, ur NEGATIVE NEGATIVE mg/dL   Urobilinogen, UA 0.2 0.0 - 1.0 mg/dL   Nitrite NEGATIVE NEGATIVE   Leukocytes, UA NEGATIVE NEGATIVE    Comment: MICROSCOPIC NOT DONE ON URINES WITH NEGATIVE PROTEIN, BLOOD, LEUKOCYTES, NITRITE, OR GLUCOSE <1000 mg/dL.  Pregnancy, urine     Status: None   Collection Time: 06/19/15 10:15 PM  Result Value Ref Range   Preg Test, Ur NEGATIVE NEGATIVE    Comment:        THE SENSITIVITY OF THIS METHODOLOGY IS >20 mIU/mL.   POC urine preg, ED (not at [facility])     Status: None   Collection Time: 06/19/15 10:26 PM  Result Value Ref Range   Preg Test, Ur NEGATIVE NEGATIVE    Comment:        THE SENSITIVITY OF THIS METHODOLOGY IS >24 mIU/mL   Bilirubin, direct     Status: Abnormal   Collection Time: 06/20/15  5:12 AM  Result Value Ref Range   Bilirubin, Direct 1.0 (H) 0.1 - 0.5 mg/dL  Comprehensive metabolic panel     Status: Abnormal   Collection Time: 06/20/15   5:12 AM  Result Value Ref Range   Sodium 136 135 - 145 mmol/L   Potassium 4.1 3.5 - 5.1 mmol/L    Comment: DELTA CHECK NOTED   Chloride 105 101 - 111 mmol/L   CO2 23 22 - 32 mmol/L   Glucose, Bld 91 65 - 99 mg/dL   BUN 7 6 - 20 mg/dL   Creatinine, Ser 0.73 0.44 - 1.00 mg/dL   Calcium 8.6 (L) 8.9 - 10.3 mg/dL   Total Protein 6.6 6.5 - 8.1 g/dL   Albumin 3.8 3.5 - 5.0 g/dL   AST 383 (H) 15 - 41 U/L   ALT 128 (H) 14 - 54 U/L   Alkaline Phosphatase 122 38 - 126 U/L   Total Bilirubin 2.6 (H) 0.3 - 1.2 mg/dL   GFR calc non Af Amer >60 >60 mL/min   GFR calc Af Amer >60 >60 mL/min    Comment: (NOTE) The eGFR has been calculated using the CKD EPI equation. This calculation has not been validated in all clinical situations. eGFR's persistently <60 mL/min signify possible Chronic Kidney Disease.    Anion gap 8 5 - 15  CBC     Status: Abnormal   Collection Time: 06/20/15  5:12 AM  Result Value Ref Range   WBC 9.4 4.0 - 10.5 K/uL   RBC 3.68 (L) 3.87 - 5.11 MIL/uL   Hemoglobin 11.0 (L) 12.0 - 15.0 g/dL   HCT 33.4 (L) 36.0 - 46.0 %   MCV 90.8 78.0 - 100.0 fL   MCH 29.9 26.0 - 34.0 pg   MCHC 32.9 30.0 - 36.0 g/dL   RDW 13.0 11.5 - 15.5 %   Platelets 179 150 - 400 K/uL  Magnesium     Status: None   Collection Time: 06/20/15  5:12 AM  Result Value Ref Range   Magnesium 1.7 1.7 - 2.4 mg/dL  Reticulocytes     Status: Abnormal   Collection Time: 06/20/15  5:12 AM  Result Value Ref Range   Retic Ct Pct 1.6 0.4 - 3.1 %   RBC. 3.68 (L) 3.87 - 5.11 MIL/uL   Retic Count, Manual 58.9 19.0 - 186.0 K/uL    US Abdomen Limited Ruq  06/20/2015   CLINICAL DATA:  34 year old female with abdominal pain  EXAM: US ABDOMEN LIMITED - RIGHT UPPER QUADRANT  COMPARISON:  None.  FINDINGS: Gallbladder:  There is diffuse thickening and edema of the gallbladder wall measuring up to 7 mm. No definite echogenic stone identified within the gallbladder. No pericholecystic fluid. Tenderness was elicited over the  gallbladder area during scanning. An acalculous cholecystitis or chronic cholecystitis is not excluded.  Common bile duct:  Diameter: 4 mm  Liver:  No focal lesion identified. Within normal limits in parenchymal echogenicity.  IMPRESSION: Diffusely thickened and edematous appearance of the gallbladder wall. No echogenic stones identified. A hepatobiliary scintigraphy may provide better evaluation of the gallbladder and gallbladder function.   Electronically Signed   By: Anner Crete M.D.   On: 06/20/2015 02:06    Review of Systems  Constitutional: Positive for chills. Negative for fever.  HENT: Negative.   Eyes: Negative.   Respiratory: Negative.   Cardiovascular: Negative.   Gastrointestinal: Positive for heartburn, nausea, vomiting and abdominal pain. Negative for diarrhea and constipation.  Genitourinary: Negative.   Musculoskeletal: Negative.   Skin: Negative.   Neurological: Negative.   Endo/Heme/Allergies: Negative.   Psychiatric/Behavioral: Negative.    Blood pressure 111/55, pulse 88, temperature 99 F (37.2 C), temperature source Oral, resp. rate 15, height 5' 8"  (1.727 m), weight 69.4 kg (153 lb), last menstrual period 05/29/2015, SpO2 100 %. Physical Exam  Vitals reviewed. Constitutional: She is oriented to person, place, and time. She appears well-developed and well-nourished.  HENT:  Head: Normocephalic and atraumatic.  Eyes: Conjunctivae and EOM are normal. Pupils are equal, round, and reactive to light. Scleral icterus is present.  Neck: Normal range of motion. Neck supple.  Cardiovascular: Normal rate, regular rhythm and normal heart sounds.   No murmur heard. Respiratory: Effort normal and breath sounds normal.  GI: Soft. Bowel sounds are normal. There is tenderness (mild epigastric tenderness).  Musculoskeletal: Normal range of motion.  Neurological: She is alert and oriented to person, place, and time. She has normal reflexes.  Skin: Skin is warm and dry.   Psychiatric: She has a normal mood and affect. Her behavior is normal. Judgment and thought content normal.    Assessment/Plan: Abdominal pain with abnormal, worsening LFT's including hyperbilirubinemia, associated with an abnormal ultrasound showing an edematous gallbladder without gallstones or dilated CBD. Suspicious for passed gallstone.  Patient is minimally symptomatic at this time and only has some mild epigastric tenderness.  She has chronic anemia, but specifically denies sickle cell anemia.  She does not have any chronic pain.  With the worsening Tbili, patient needs an MRCP and possibly ERCP.  LFT's should be repeated tomorrow AM.  GI consultation suggested.  Angeligue Bowne 06/20/2015, 6:31 AM

## 2015-06-21 ENCOUNTER — Telehealth: Payer: Self-pay | Admitting: *Deleted

## 2015-06-21 ENCOUNTER — Encounter: Payer: Self-pay | Admitting: Gastroenterology

## 2015-06-21 DIAGNOSIS — D649 Anemia, unspecified: Secondary | ICD-10-CM

## 2015-06-21 DIAGNOSIS — R109 Unspecified abdominal pain: Secondary | ICD-10-CM | POA: Insufficient documentation

## 2015-06-21 LAB — HEPATITIS B SURFACE ANTIGEN: HEP B S AG: NEGATIVE

## 2015-06-21 LAB — ANTI-SMOOTH MUSCLE ANTIBODY, IGG: F-ACTIN AB IGG: 7 U (ref 0–19)

## 2015-06-21 LAB — HEPATIC FUNCTION PANEL
ALBUMIN: 3.2 g/dL — AB (ref 3.5–5.0)
ALT: 79 U/L — ABNORMAL HIGH (ref 14–54)
AST: 93 U/L — AB (ref 15–41)
Alkaline Phosphatase: 106 U/L (ref 38–126)
BILIRUBIN TOTAL: 1 mg/dL (ref 0.3–1.2)
Bilirubin, Direct: 0.2 mg/dL (ref 0.1–0.5)
Indirect Bilirubin: 0.8 mg/dL (ref 0.3–0.9)
TOTAL PROTEIN: 5.9 g/dL — AB (ref 6.5–8.1)

## 2015-06-21 LAB — HEPATITIS PANEL, ACUTE
HCV Ab: <0.1 s/co ratio (ref 0.0–0.9)
HEP A IGM: NEGATIVE
HEP B C IGM: NEGATIVE
HEP B S AG: NEGATIVE

## 2015-06-21 LAB — FOLATE RBC
FOLATE, RBC: 1315 ng/mL (ref >498)
Folate, Hemolysate: 443.3 ng/mL
HEMATOCRIT: 33.7 % — AB (ref 34.0–46.6)

## 2015-06-21 LAB — ANTINUCLEAR ANTIBODIES, IFA: ANA Ab, IFA: NEGATIVE

## 2015-06-21 LAB — HEPATITIS C ANTIBODY

## 2015-06-21 NOTE — Discharge Summary (Signed)
Physician Discharge Summary  Kerri Baldwin ZOX:[id number] DOB: 1981-07-21 DOA: 06/19/2015  PCP: No PCP Per Patient  Admit date: 06/19/2015 Discharge date: 06/21/2015  Recommendations for Outpatient Follow-up:  1. Follow-up with gastroenterology in one month if needed 2. Follow-up ANA and anti-smooth muscle antibody and folate  Discharge Diagnoses:  Principal Problem:   Abnormal transaminases Active Problems:   Hypokalemia   Anemia   Discharge Condition: Stable, improved  Diet recommendation: Regular  Wt Readings from Last 3 Encounters:  06/19/15 69.4 kg (153 lb)  06/07/13 76.204 kg (168 lb)    History of present illness:  The patient is a 34 year old female with history of anemia and back pain who presented to the emergency department with epigastric and right upper pain. She had had nausea with vomiting and diarrhea. She had been at a party tonight's prior to admission and had drank 2 cocktails and one beer. She felt fine following morning, but that evening, she developed abdominal pain that progressed to 10 out of 10 pain. She denied exposure to anyone with diarrhea, IV drug use, Tylenol overdose or use. She has been sexually active over the last few months. She does not know if there is a family history of any autoimmune diseases or liver problems.  Hospital Course:   Transaminitis, possibly due to alcohol consumption on Saturday, however she stated did not have that much to drink. The 3 to 1 AST to ALT ratio suggested of alcohol. It is possible that she passed a gallstone, however her abdominal ultrasound did not demonstrate any evidence of gallstones, neither did her follow-up MRCP. She had a negative HIDA scan. General surgery signed off.  Gastroenterology recommended checking ANA and anti-smooth muscle antibody, both of which are pending. Her acute hepatitis panel was negative. She was given antiemetics and pain medication and IV fluids and her liver function tests gradually  trended down.  She was able to eat and drink and her abdominal plain completely resolved prior to discharge.  She was advised to not drink alcohol.  She should also not use Tylenol until she is told that it is safe by her primary care doctor.  Follow-up with her primary care doctor in 1 week to review pending blood work.  Hypokalemia, resolved with replacement. Magnesium 1.7.  Normocytic anemia, iron studies within normal limits, vitamin B12 702, folate pending.   Consultants: General surgery Gastroenterology  Procedures: Abdominal ultrasound MRCP HIDA scan  Antibiotics:  Negative  Discharge Exam: Filed Vitals:   06/21/15 0522  BP: 117/73  Pulse: 51  Temp: 98.4 F (36.9 C)  Resp: 16   Filed Vitals:   06/20/15 0322 06/20/15 1500 06/20/15 2055 06/21/15 0522  BP: 111/55 104/63 111/63 117/73  Pulse: 88 92 81 51  Temp: 99 F (37.2 C) 98.9 F (37.2 C) 99 F (37.2 C) 98.4 F (36.9 C)  TempSrc: Oral   Oral  Resp: Height:      Weight:      SpO2: 100% 100% 100% 100%     General: Adult female, No acute distress  HEENT: NCAT, MMM, no scleral icterus or jaundice  Cardiovascular: RRR, nl S1, S2 no mrg, 2+ pulses, warm extremities  Respiratory: CTAB, no increased WOB  Abdomen: NABS, soft, NT/ND  MSK: Normal tone and bulk, no LEE  Neuro: Grossly intact  Discharge Instructions      Discharge Instructions    Call MD for:  difficulty breathing, headache or visual disturbances    Complete by:  As directed      Call MD for:  extreme fatigue    Complete by:  As directed      Call MD for:  hives    Complete by:  As directed      Call MD for:  persistant dizziness or light-headedness    Complete by:  As directed      Call MD for:  persistant nausea and vomiting    Complete by:  As directed      Call MD for:  severe uncontrolled pain    Complete by:  As directed      Call MD for:  temperature >100.4    Complete by:  As directed      Diet  general    Complete by:  As directed      Discharge instructions    Complete by:  As directed   You were hospitalized with abdominal pain and were found to have inflammation of the liver.  You do not have hepatitis A, B, or C, nor do you have gallstones or problems with your gallbladder that might have caused this.  Please do not drink alcohol or use tylenol (acetaminophen) until your primary care doctor says it's okay.  Please follow up with gastroenterology in about a month to review your pending blood work.     Increase activity slowly    Complete by:  As directed             Medication List    STOP taking these medications        diclofenac 50 MG EC tablet  Commonly known as:  VOLTAREN     methocarbamol 500 MG tablet  Commonly known as:  ROBAXIN     traMADol 50 MG tablet  Commonly known as:  ULTRAM      TAKE these medications        ibuprofen 600 MG tablet  Commonly known as:  ADVIL,MOTRIN  Take 1 tablet (600 mg total) by mouth every 6 (six) hours as needed.       Follow-up Information    Follow up with Ruffin Frederick, MD On 08/05/2015.   Specialty:  Gastroenterology   Why:  9:45 for follow up with GI doctor.    Contact information:   [address] [place] [address] [phone]        The results of significant diagnostics from this hospitalization (including imaging, microbiology, ancillary and laboratory) are listed below for reference.    Significant Diagnostic Studies: Nm Hepatobiliary Liver Func  06/20/2015   CLINICAL DATA:  Abdominal pain with progressive elevated liver enzymes. Gallbladder demonstrating edematous wall  EXAM: NUCLEAR MEDICINE HEPATOBILIARY IMAGING  TECHNIQUE: Sequential images of the abdomen were obtained out to 60 minutes following intravenous administration of radiopharmaceutical.  Views:  Anterior, right lateral right upper quadrant  Radionuclide:  Technetium 90m Choletec  Dose:  5.0 mCi  Route of administration:  Intravenous  COMPARISON:  June 20, 2015 ultrasound right upper quadrant; abdominal MRI June 20, 2015.  FINDINGS: Note that 1.78 mg of morphine was administered intravenously to facilitate gallbladder visualization.  Liver uptake of radiotracer is normal. There is visualization of gallbladder and small bowel, indicating patency of the cystic and common bile ducts.  IMPRESSION: Gallbladder and small bowel visualize, indicating patency of the cystic and common bile ducts.   Electronically Signed   By: Bretta Bang III M.D.   On: 06/20/2015 14:37   Mr Roe Coombs W/wo Cm/mrcp  06/20/2015   CLINICAL DATA:  Patient with acute onset abdominal pain yesterday morning. Nausea. Vomiting. Right upper quadrant pain radiating and crossing midline.  EXAM: MRI ABDOMEN WITHOUT AND WITH CONTRAST (INCLUDING MRCP)  TECHNIQUE: Multiplanar multisequence MR imaging of the abdomen was performed both before and after the administration of intravenous contrast. Heavily T2-weighted images of the biliary and pancreatic ducts were obtained, and three-dimensional MRCP images were rendered by post processing.  CONTRAST:  14mL MULTIHANCE GADOBENATE DIMEGLUMINE 529 MG/ML IV SOLN  COMPARISON:  Ultrasound abdomen 06/20/2015  FINDINGS: Lower chest:  Unremarkable  Hepatobiliary: Liver is normal in size and contour. No focal hepatic lesion identified. No cholelithiasis. No intrahepatic or extrahepatic biliary ductal dilatation. Minimal amount of nonspecific fluid about the gallbladder. No gallbladder wall thickening.  Pancreas: Unremarkable  Spleen: Unremarkable  Adrenals/Urinary Tract: Normal adrenal glands. Kidneys enhance symmetrically with contrast. No hydronephrosis.  Stomach/Bowel: No abnormal bowel wall thickening or evidence for bowel obstruction.  Vascular/Lymphatic: Normal caliber abdominal aorta. No retroperitoneal lymphadenopathy.  Other: None  Musculoskeletal: Normal osseous marrow signal.  IMPRESSION: Minimal amount of  nonspecific fluid about the gallbladder. No gallbladder wall thickening, intrahepatic or extrahepatic biliary ductal dilatation or surrounding inflammatory change involving the right upper quadrant.   Electronically Signed   By: Annia Belt M.D.   On: 06/20/2015 09:57   US Abdomen Limited Ruq  06/20/2015   CLINICAL DATA:  34 year old female with abdominal pain  EXAM: US ABDOMEN LIMITED - RIGHT UPPER QUADRANT  COMPARISON:  None.  FINDINGS: Gallbladder:  There is diffuse thickening and edema of the gallbladder wall measuring up to 7 mm. No definite echogenic stone identified within the gallbladder. No pericholecystic fluid. Tenderness was elicited over the gallbladder area during scanning. An acalculous cholecystitis or chronic cholecystitis is not excluded.  Common bile duct:  Diameter: 4 mm  Liver:  No focal lesion identified. Within normal limits in parenchymal echogenicity.  IMPRESSION: Diffusely thickened and edematous appearance of the gallbladder wall. No echogenic stones identified. A hepatobiliary scintigraphy may provide better evaluation of the gallbladder and gallbladder function.   Electronically Signed   By: Elgie Collard M.D.   On: 06/20/2015 02:06    Microbiology: No results found for this or any previous visit (from the past 240 hour(s)).   Labs: Basic Metabolic Panel:  Recent Labs Lab 06/19/15 2118 06/20/15 0512  NA 136 136  K 3.3* 4.1  CL 105 105  CO2 22 23  GLUCOSE 89 91  BUN 7 7  CREATININE 0.65 0.73  CALCIUM 9.0 8.6*  MG  --  1.7   Liver Function Tests:  Recent Labs Lab 06/19/15 2118 06/20/15 0512 06/21/15 0548  AST 373* 383* 93*  ALT 79* 128* 79*  ALKPHOS 96 122 106  BILITOT 1.9* 2.6* 1.0  PROT 6.9 6.6 5.9*  ALBUMIN 4.1 3.8 3.2*    Recent Labs Lab 06/19/15 2118  LIPASE 19*   No results for input(s): AMMONIA in the last 168 hours. CBC:  Recent Labs Lab 06/19/15 2118 06/20/15 0512  WBC 9.7 9.4  HGB 11.3* 11.0*  HCT 33.2* 33.4*  MCV 91.2  90.8  PLT 168 179   Cardiac Enzymes: No results for input(s): CKTOTAL, CKMB, CKMBINDEX, TROPONINI in the last 168 hours. BNP: BNP (last 3 results) No results for input(s): BNP in the last 8760 hours.  ProBNP (last 3 results) No results for input(s): PROBNP in the last 8760 hours.  CBG: No results for input(s): GLUCAP in the last 168 hours.  Time  coordinating discharge: 25 minutes  Signed:  Janesha Brissette  [facility] 06/21/2015, 12:06 PM

## 2015-06-21 NOTE — Telephone Encounter (Signed)
-----   Message from Ruffin Frederick, MD sent at 06/21/2015 12:10 PM EDT ----- Rene Kocher can you help book me a follow up with this patient in 3-4 weeks in clinic? She needs LFTs prior to the visit. Thanks

## 2015-06-21 NOTE — Progress Notes (Signed)
Daily Rounding Note  06/21/2015, 10:34 AM    SUBJECTIVE:       No recurrent pain.  Eating solids, no nausea.  Feels great.  asking if ok to discharge today.    OBJECTIVE:         Vital signs in last 24 hours:    Temp:  [98.4 F (36.9 C)-99 F (37.2 C)] 98.4 F (36.9 C) (09/13 0522) Pulse Rate:  [51-92] 51 (09/13 0522) Resp:  [16] 16 (09/13 0522) BP: (104-117)/(63-73) 117/73 mmHg (09/13 0522) SpO2:  [100 %] 100 % (09/13 0522) Last BM Date: 06/19/15 Filed Weights   06/19/15 2106  Weight: 153 lb (69.4 kg)   General: pleasant, comfortable.  Healthy looking   Heart: RRR Chest: clear bil.  No MRG Abdomen: soft, NT, ND.  Active BS  Extremities: no CCE Neuro/Psych:  Pleasant, oriented x 3.  No gross deficits.   Intake/Output from previous day: 09/12 0701 - 09/13 0700 In: 1220 [P.O.:120; I.V.:1100] Out: -   Intake/Output this shift: Total I/O In: 240 [P.O.:240] Out: -   Lab Results:  Recent Labs  06/19/15 2118 06/20/15 0512  WBC 9.7 9.4  HGB 11.3* 11.0*  HCT 33.2* 33.4*  PLT 168 179   BMET  Recent Labs  06/19/15 2118 06/20/15 0512  NA 136 136  K 3.3* 4.1  CL 105 105  CO2 22 23  GLUCOSE 89 91  BUN 7 7  CREATININE 0.65 0.73  CALCIUM 9.0 8.6*   LFT  Recent Labs  06/19/15 2118 06/20/15 0512 06/21/15 0548  PROT 6.9 6.6 5.9*  ALBUMIN 4.1 3.8 3.2*  AST 373* 383* 93*  ALT 79* 128* 79*  ALKPHOS 96 122 106  BILITOT 1.9* 2.6* 1.0  BILIDIR  --  1.0* 0.2  IBILI  --   --  0.8   PT/INR No results for input(s): LABPROT, INR in the last 72 hours. Hepatitis Panel  Recent Labs  06/20/15 0512 06/20/15 1709  HEPBSAG Negative Negative  HCVAB <0.1 <0.1  HEPAIGM Negative  --   HEPBIGM Negative  --     Studies/Results: Nm Hepatobiliary Liver Func  06/20/2015   CLINICAL DATA:  Abdominal pain with progressive elevated liver enzymes. Gallbladder demonstrating edematous wall  EXAM: NUCLEAR  MEDICINE HEPATOBILIARY IMAGING  TECHNIQUE: Sequential images of the abdomen were obtained out to 60 minutes following intravenous administration of radiopharmaceutical.  Views:  Anterior, right lateral right upper quadrant  Radionuclide:  Technetium 47m Choletec  Dose:  5.0 mCi  Route of administration: Intravenous  COMPARISON:  June 20, 2015 ultrasound right upper quadrant; abdominal MRI June 20, 2015.  FINDINGS: Note that 1.78 mg of morphine was administered intravenously to facilitate gallbladder visualization.  Liver uptake of radiotracer is normal. There is visualization of gallbladder and small bowel, indicating patency of the cystic and common bile ducts.  IMPRESSION: Gallbladder and small bowel visualize, indicating patency of the cystic and common bile ducts.   Electronically Signed   By: Bretta Bang III M.D.   On: 06/20/2015 14:37   Mr Roe Coombs W/wo Cm/mrcp  06/20/2015   CLINICAL DATA:  Patient with acute onset abdominal pain yesterday morning. Nausea. Vomiting. Right upper quadrant pain radiating and crossing midline.  EXAM: MRI ABDOMEN WITHOUT AND WITH CONTRAST (INCLUDING MRCP)  TECHNIQUE: Multiplanar multisequence MR imaging of the abdomen was performed both before and after the administration of intravenous contrast. Heavily T2-weighted images of the biliary and pancreatic ducts were obtained, and  three-dimensional MRCP images were rendered by post processing.  CONTRAST:  14mL MULTIHANCE GADOBENATE DIMEGLUMINE 529 MG/ML IV SOLN  COMPARISON:  Ultrasound abdomen 06/20/2015  FINDINGS: Lower chest:  Unremarkable  Hepatobiliary: Liver is normal in size and contour. No focal hepatic lesion identified. No cholelithiasis. No intrahepatic or extrahepatic biliary ductal dilatation. Minimal amount of nonspecific fluid about the gallbladder. No gallbladder wall thickening.  Pancreas: Unremarkable  Spleen: Unremarkable  Adrenals/Urinary Tract: Normal adrenal glands. Kidneys enhance symmetrically with  contrast. No hydronephrosis.  Stomach/Bowel: No abnormal bowel wall thickening or evidence for bowel obstruction.  Vascular/Lymphatic: Normal caliber abdominal aorta. No retroperitoneal lymphadenopathy.  Other: None  Musculoskeletal: Normal osseous marrow signal.  IMPRESSION: Minimal amount of nonspecific fluid about the gallbladder. No gallbladder wall thickening, intrahepatic or extrahepatic biliary ductal dilatation or surrounding inflammatory change involving the right upper quadrant.   Electronically Signed   By: Annia Belt M.D.   On: 06/20/2015 09:57   US Abdomen Limited Ruq  06/20/2015   CLINICAL DATA:  34 year old female with abdominal pain  EXAM: US ABDOMEN LIMITED - RIGHT UPPER QUADRANT  COMPARISON:  None.  FINDINGS: Gallbladder:  There is diffuse thickening and edema of the gallbladder wall measuring up to 7 mm. No definite echogenic stone identified within the gallbladder. No pericholecystic fluid. Tenderness was elicited over the gallbladder area during scanning. An acalculous cholecystitis or chronic cholecystitis is not excluded.  Common bile duct:  Diameter: 4 mm  Liver:  No focal lesion identified. Within normal limits in parenchymal echogenicity.  IMPRESSION: Diffusely thickened and edematous appearance of the gallbladder wall. No echogenic stones identified. A hepatobiliary scintigraphy may provide better evaluation of the gallbladder and gallbladder function.   Electronically Signed   By: Elgie Collard M.D.   On: 06/20/2015 02:06    ASSESMENT:   *  Abnormal LFTs, epigastric pain. ? Cholecystitis per ultrasound but normal HIDA. Mild  pericholcystic fluid on MRCP.  Moderate ETOH.  Negative thus far: Hep C Ab, Hep B core IgM, Hep B surface Ag, Hep A IgM,  Pending are: ANA, smooth muscle Ab,  LFTs resolving.      PLAN   *  ? Home today.  ? GI office visit in a month or so, if still having GI troubles could set her up for EGD.     Jennye Moccasin  06/21/2015, 10:34 AM Pager:  [phone]   Attending Addendum: I have taken an interval history, reviewed the chart, and examined the patient. I agree with the Advanced Practitioner's note and impression. RUQ US showed some changes initially concerning for cholecystitis but HIDA is normal. MRCP done and showed no dilation of ducts and no GB thickening, only mild fluid around the GB. She had severe pain initially which has since resolved and tolerating a diet  Previous AST > ALT elevation noted with mild elevation of bilirubin, but now downtrending. Agree with previously ordered labs to work up transaminitis, however still possible possible she had a gallstone in the duct and it passed given how she describes her pain and how it has resolved. No risk factors for PUD and this would seem unlikely to resolve without therapy so quickly. Okay to discharge from my perspective today. I will see her in the GI clinic in a few weeks for reassessment and repeat LFTs at that time to ensure normalized. I asked her to call me if she has recurrent symptoms in the interim.   Ileene Patrick, MD [facility] Pager [phone]

## 2015-06-21 NOTE — Telephone Encounter (Signed)
Patient is scheduled on 08/05/15 with MD. Will call and tell her about lab.

## 2015-06-22 NOTE — Telephone Encounter (Signed)
Patient aware and she will "try to remember" to have lab done prior to OV.

## 2015-06-22 NOTE — Telephone Encounter (Signed)
Left a message for patient to call back. 

## 2015-08-05 ENCOUNTER — Ambulatory Visit (INDEPENDENT_AMBULATORY_CARE_PROVIDER_SITE_OTHER): Payer: Self-pay | Admitting: Gastroenterology

## 2015-08-05 ENCOUNTER — Other Ambulatory Visit (INDEPENDENT_AMBULATORY_CARE_PROVIDER_SITE_OTHER): Payer: Self-pay

## 2015-08-05 ENCOUNTER — Encounter: Payer: Self-pay | Admitting: Gastroenterology

## 2015-08-05 VITALS — BP 96/60 | HR 72 | Ht 67.75 in | Wt 169.1 lb

## 2015-08-05 DIAGNOSIS — R1310 Dysphagia, unspecified: Secondary | ICD-10-CM

## 2015-08-05 DIAGNOSIS — R7401 Elevation of levels of liver transaminase levels: Secondary | ICD-10-CM

## 2015-08-05 DIAGNOSIS — R109 Unspecified abdominal pain: Secondary | ICD-10-CM

## 2015-08-05 DIAGNOSIS — R740 Nonspecific elevation of levels of transaminase and lactic acid dehydrogenase [LDH]: Secondary | ICD-10-CM

## 2015-08-05 LAB — HEPATIC FUNCTION PANEL
ALK PHOS: 66 U/L (ref 39–117)
ALT: 14 U/L (ref 0–35)
AST: 23 U/L (ref 0–37)
Albumin: 4 g/dL (ref 3.5–5.2)
BILIRUBIN DIRECT: 0.1 mg/dL (ref 0.0–0.3)
BILIRUBIN TOTAL: 0.5 mg/dL (ref 0.2–1.2)
Total Protein: 6.7 g/dL (ref 6.0–8.3)

## 2015-08-05 NOTE — Progress Notes (Signed)
HPI : I agree with the Advanced Practitioner's note and impression. RUQ US showed some changes initially concerning for cholecystitis but HIDA is normal. MRCP done and showed no dilation of ducts and no GB thickening, only mild fluid around the GB. She had severe pain initially which has since resolved and tolerating a diet Previous AST > ALT elevation noted with mild elevation of bilirubin, but now downtrending. Agree with previously ordered labs to work up transaminitis, however still possible possible she had a gallstone in the duct and it passed given how she describes her pain and how it has resolved. No risk factors for PUD and this would seem unlikely to resolve without therapy so quickly. Okay to discharge from my perspective today. I will see her in the GI clinic in a few weeks for reassessment and repeat LFTs at that time to ensure normalized. I asked her to call me if she has recurrent symptoms in the interim.   34 y/o female: here for follow up. I had previously seen her when she was hospitalized earlier in September. She presented with acute onset epigastric to LUQ pain, without nausea or vomiting. She had an acute rise in AST > ALT with mild bilirubinemia.  RUQ US showed some changes initially concerning for cholecystitis but HIDA was normal. MRCP was done and showed no dilation of ducts and no GB thickening, only mild fluid around the GB. She had quick resolution of her pain while admitted and was discharged tolerating a diet.  She has been doing well since her last visit. She has not had any further abdominal pains since her discharge. She reported it was a one time pain that was severe, she has never had that pain previously. She takes ibuprofen PRN, not routinely. She smokes cigarettes, 1/3rd pack per day. She reports she has roughly 2 drinks per setting when she drinks, she drinks a few times per week. No FH of liver disease. She had negative testing for viral hepatitis and AIH when  admitted.   She reports she has had 2 episodes of dysphagia recently within the past month, to solids. She ate something, it was not painful but took many swallows to push food down and got hung up in her esophagus. She has only had 2 episodes of this. She reports episodes lasted a few hours prior to food going down all the way. She otherwise states she has been eating ok for the most part. She has not been having much postprandial pain. No nausea or vomiting. No reflux symptoms. No prior EGDs.    Past Medical History  Diagnosis Date  . Back pain   . Anemia      Past Surgical History  Procedure Laterality Date  . Induced abortion    . Wisdom tooth extraction     Family History  Problem Relation Age of Onset  . Breast cancer Paternal Grandmother   . Bone cancer Cousin   . Cancer Maternal Grandfather     type unknown  . Cancer Paternal Uncle     type unknown  . Diabetes Father   . Diabetes Paternal Uncle    Social History  Substance Use Topics  . Smoking status: Current Every Day Smoker -- 0.25 packs/day    Types: Cigarettes  . Smokeless tobacco: Never Used  . Alcohol Use: 0.0 oz/week    0 Standard drinks or equivalent per week     Comment: occ   Current Outpatient Prescriptions  Medication Sig Dispense Refill  .  ibuprofen (ADVIL,MOTRIN) 600 MG tablet Take 1 tablet (600 mg total) by mouth every 6 (six) hours as needed. 30 tablet 0  . Multiple Vitamin (MULTIVITAMIN) tablet Take 1 tablet by mouth daily.     No current facility-administered medications for this visit.   No Known Allergies   Review of Systems: All systems reviewed and negative except where noted in HPI.   Lab Results  Component Value Date   WBC 9.4 06/20/2015   HGB 11.0* 06/20/2015   HCT 33.4* 06/20/2015   HCT 33.7* 06/20/2015   MCV 90.8 06/20/2015   PLT 179 06/20/2015    Lab Results  Component Value Date   ALT 79* 06/21/2015   AST 93* 06/21/2015   ALKPHOS 106 06/21/2015   BILITOT 1.0  06/21/2015    Lab Results  Component Value Date   CREATININE 0.73 06/20/2015   BUN 7 06/20/2015   NA 136 06/20/2015   K 4.1 06/20/2015   CL 105 06/20/2015   CO2 23 06/20/2015   Lab Results  Component Value Date   FERRITIN 69 06/20/2015   Lab Results  Component Value Date   IRON 35 06/20/2015   TIBC 305 06/20/2015   FERRITIN 69 06/20/2015   Lab Results  Component Value Date   HEPAIGM Negative 06/20/2015   HEPBIGM Negative 06/20/2015   Negative viral hepatitis panel, negative autoimmune hepatitis markers.   Imaging as outlined in HPI  Physical Exam: BP 96/60 mmHg  Pulse 72  Ht 5' 7.75" (1.721 m)  Wt 169 lb 2 oz (76.715 kg)  BMI 25.90 kg/m2 Constitutional: Pleasant,well-developed, female in no acute distress. HEENT: Normocephalic and atraumatic. Conjunctivae are normal. No scleral icterus. Neck supple.  Cardiovascular: Normal rate, regular rhythm.  Pulmonary/chest: Effort normal and breath sounds normal. No wheezing, rales or rhonchi. Abdominal: Soft, nondistended, nontender. Bowel sounds active throughout. There are no masses palpable. No hepatomegaly. Extremities: no edema Lymphadenopathy: No cervical adenopathy noted. Neurological: Alert and oriented to person place and time. Skin: Skin is warm and dry. No rashes noted. Psychiatric: Normal mood and affect. Behavior is normal.   ASSESSMENT AND PLAN: 34 y/o female who was admitted in September for new onset abdominal pain with associated AST > ALT with mild bilirubinemia. Symptoms short lived. Imaging as above. Nonspecific mild inflammatory changes of the GB but normal HIDA and MRCP. Her transminases downtrended quickly as her symptoms abated. Viral hepatitis serologies negative as her autoimmune. With her AST>ALT pattern, she does endorse some alcohol use but not around the time of her presentation. She denied muscle soreness as CK elevation could also cause this pattern.   She has not had abdominal pain since this  episode. It is possible she could have passed a small gallstone not seen on imaging given transient pain with transaminitis. SOD would seem less likely although possible. At this time will repeat LFTs to ensure they normalized. If she has a chronic transaminitis this will need to be further worked up with other chronic liver disease serologies. If she has normalized enyzmes, will then monitor for recurrence of symptoms and she will need to contact us or seek immediate evaluation should she have symptoms again.   Patient otherwise with 2 episodes of dysphagia in the past month which are new for her. Otherwise no baseline symptoms. I offered her an EGD to evaluate this but she did not want invasive testing at this time. I otherwise offered her a barium swallow to evaluate, ensure no stricture / stenosis, but she wishes to  monitor for now and declined. If symptoms persist she will call us back for reassessment.   She agreed with the plan, all questions answered.   Ileene PatrickSteven Armbruster, MD [facility] Pager [phone]

## 2015-08-05 NOTE — Patient Instructions (Addendum)
You have been given a separate informational sheet regarding your tobacco use, the importance of quitting and local resources to help you quit.  Your physician has requested that you go to the basement for the following lab work before leaving today: Hepatic

## 2016-12-25 ENCOUNTER — Emergency Department (HOSPITAL_COMMUNITY)
Admission: EM | Admit: 2016-12-25 | Discharge: 2016-12-25 | Disposition: A | Discharge status: Home / Self Care | Payer: Self-pay | Attending: Emergency Medicine | Admitting: Emergency Medicine

## 2016-12-25 ENCOUNTER — Encounter (HOSPITAL_COMMUNITY): Payer: Self-pay | Admitting: *Deleted

## 2016-12-25 ENCOUNTER — Emergency Department (HOSPITAL_COMMUNITY): Payer: Self-pay

## 2016-12-25 DIAGNOSIS — S8011XA Contusion of right lower leg, initial encounter: Secondary | ICD-10-CM | POA: Insufficient documentation

## 2016-12-25 DIAGNOSIS — Y999 Unspecified external cause status: Secondary | ICD-10-CM | POA: Insufficient documentation

## 2016-12-25 DIAGNOSIS — S60222A Contusion of left hand, initial encounter: Secondary | ICD-10-CM | POA: Insufficient documentation

## 2016-12-25 DIAGNOSIS — T07XXXA Unspecified multiple injuries, initial encounter: Secondary | ICD-10-CM

## 2016-12-25 DIAGNOSIS — F17210 Nicotine dependence, cigarettes, uncomplicated: Secondary | ICD-10-CM | POA: Insufficient documentation

## 2016-12-25 DIAGNOSIS — Y929 Unspecified place or not applicable: Secondary | ICD-10-CM | POA: Insufficient documentation

## 2016-12-25 DIAGNOSIS — S20219A Contusion of unspecified front wall of thorax, initial encounter: Secondary | ICD-10-CM | POA: Insufficient documentation

## 2016-12-25 DIAGNOSIS — Y9389 Activity, other specified: Secondary | ICD-10-CM | POA: Insufficient documentation

## 2016-12-25 DIAGNOSIS — S8012XA Contusion of left lower leg, initial encounter: Secondary | ICD-10-CM | POA: Insufficient documentation

## 2016-12-25 DIAGNOSIS — S60221A Contusion of right hand, initial encounter: Secondary | ICD-10-CM | POA: Insufficient documentation
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
## 2016-12-25 NOTE — Discharge Instructions (Signed)
Your exam and x-rays are reassuring. Please continue taking ibuprofen and Tylenol for your discomfort. You may also implement Rice therapy as we discussed. Follow up with your doctor. Return to ED for new or worsening symptoms.
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
## 2018-04-14 ENCOUNTER — Encounter (HOSPITAL_COMMUNITY): Payer: Self-pay | Admitting: Emergency Medicine

## 2018-04-14 ENCOUNTER — Emergency Department (HOSPITAL_COMMUNITY)
Admission: EM | Admit: 2018-04-14 | Discharge: 2018-04-14 | Disposition: A | Discharge status: Home / Self Care | Payer: Self-pay | Attending: Emergency Medicine | Admitting: Emergency Medicine

## 2018-04-14 ENCOUNTER — Emergency Department (HOSPITAL_COMMUNITY): Payer: Self-pay

## 2018-04-14 ENCOUNTER — Other Ambulatory Visit: Payer: Self-pay

## 2018-04-14 DIAGNOSIS — M79645 Pain in left finger(s): Secondary | ICD-10-CM | POA: Insufficient documentation

## 2018-04-14 DIAGNOSIS — Z79899 Other long term (current) drug therapy: Secondary | ICD-10-CM | POA: Insufficient documentation

## 2018-04-14 DIAGNOSIS — F17210 Nicotine dependence, cigarettes, uncomplicated: Secondary | ICD-10-CM | POA: Insufficient documentation

## 2018-04-14 MED ORDER — IBUPROFEN 800 MG PO TABS
800.0000 mg | ORAL_TABLET | Freq: Once | ORAL | Status: AC
  Administered 2018-04-14: 800 mg via ORAL
  Filled 2018-04-14: qty 1

## 2018-04-14 MED ORDER — IBUPROFEN 800 MG PO TABS: 800.0000 mg | ORAL_TABLET | Freq: Three times a day (TID) | ORAL | 0 refills | Status: DC

## 2018-04-14 MED ORDER — ACETAMINOPHEN 500 MG PO TABS
1000.0000 mg | ORAL_TABLET | Freq: Once | ORAL | Status: AC
  Administered 2018-04-14: 1000 mg via ORAL
  Filled 2018-04-14: qty 2
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
## 2018-04-14 NOTE — ED Provider Notes (Signed)
[facility]   CSN: [id number] Arrival date & time: 04/14/18  [id number]     History   Chief Complaint Chief Complaint  Patient presents with  . thumb pain    HPI Kerri Baldwin is a 37 y.o. female.  The history is provided by the patient. No language interpreter was used.  Extremity Pain  This is a new problem. The current episode started 3 to 5 hours ago. The problem occurs constantly. The problem has not changed since onset.Pertinent negatives include no chest pain, no abdominal pain, no headaches and no shortness of breath. Nothing aggravates the symptoms. Nothing relieves the symptoms. She has tried nothing for the symptoms. The treatment provided no relief.  Jammed left thum at work just PTA.  No wound.    Past Medical History:  Diagnosis Date  . Anemia   . Back pain     Patient Active Problem List   Diagnosis Date Noted  . Abdominal pain   . Transaminitis 06/20/2015  . Abnormal transaminases 06/20/2015  . Hypokalemia 06/20/2015  . Anemia 06/20/2015    Past Surgical History:  Procedure Laterality Date  . INDUCED ABORTION    . WISDOM TOOTH EXTRACTION       OB History    Gravida  1   Para      Term      Preterm      AB  1   Living  2     SAB  1   TAB      Ectopic      Multiple      Live Births               Home Medications    Prior to Admission medications   Medication Sig Start Date End Date Taking? Authorizing Provider  ibuprofen (ADVIL,MOTRIN) 600 MG tablet Take 1 tablet (600 mg total) by mouth every 6 (six) hours as needed. 03/28/14   Marlon PelGreene, Tiffany, PA-C  Multiple Vitamin (MULTIVITAMIN) tablet Take 1 tablet by mouth daily.    [provider]    Family History Family History  Problem Relation Age of Onset  . Breast cancer Paternal Grandmother   . Bone cancer Cousin   . Cancer Maternal Grandfather        type unknown  . Cancer Paternal Uncle        type unknown  .  Diabetes Father   . Diabetes Paternal Uncle     Social History Social History   Tobacco Use  . Smoking status: Current Every Day Smoker    Packs/day: 0.25    Types: Cigarettes  . Smokeless tobacco: Never Used  Substance Use Topics  . Alcohol use: Yes    Alcohol/week: 0.0 oz    Comment: occ  . Drug use: Yes    Frequency: 7.0 times per week    Types: Marijuana     Allergies   Patient has no known allergies.   Review of Systems Review of Systems  Constitutional: Negative for fever.  HENT: Negative for ear discharge.   Eyes: Negative for visual disturbance.  Respiratory: Negative for shortness of breath.   Cardiovascular: Negative for chest pain.  Gastrointestinal: Negative for abdominal pain.  Genitourinary: Negative for hematuria.  Musculoskeletal: Positive for arthralgias. Negative for joint swelling.  Neurological: Negative for weakness, numbness and headaches.  All other systems reviewed and are negative.    Physical Exam Updated Vital Signs BP 106/71   Pulse 64  Temp 98.6 F (37 C) (Oral)   Resp 16   Ht 5' 7.75" (1.721 m)   Wt 68 kg (150 lb)   SpO2 99%   BMI 22.98 kg/m   Physical Exam  Constitutional: She is oriented to person, place, and time. She appears well-developed and well-nourished. No distress.  HENT:  Head: Normocephalic and atraumatic.  Mouth/Throat: No oropharyngeal exudate.  Eyes: Pupils are equal, round, and reactive to light. Conjunctivae are normal.  Neck: Normal range of motion. Neck supple.  Cardiovascular: Normal rate, regular rhythm, normal heart sounds and intact distal pulses.  Pulmonary/Chest: Effort normal and breath sounds normal. No stridor. She has no wheezes. She has no rales.  Abdominal: Soft. Bowel sounds are normal. She exhibits no mass. There is no tenderness. There is no rebound and no guarding.  Musculoskeletal: Normal range of motion. She exhibits no edema.       Left wrist: Normal.       Left hand: Normal. She  exhibits normal capillary refill. Normal sensation noted. Normal strength noted.  Neurological: She is alert and oriented to person, place, and time. She displays normal reflexes.  Skin: Skin is warm and dry. Capillary refill takes less than 2 seconds.  Psychiatric: She has a normal mood and affect.     ED Treatments / Results  Labs (all labs ordered are listed, but only abnormal results are displayed) Labs Reviewed - No data to display   Radiology Dg Finger Thumb Left  Result Date: 04/14/2018 CLINICAL DATA:  Left thumb pain after injury. Jammed thumb last night while cleaning. Swelling. EXAM: LEFT THUMB 2+V COMPARISON:  None. FINDINGS: There is no evidence of fracture or dislocation. Accessory ossicle at the thumb interphalangeal joint. There is no evidence of arthropathy or other focal bone abnormality. Soft tissues are unremarkable IMPRESSION: Negative radiographs of the left thumb. Electronically Signed   By: Rubye Oaks M.D.   On: 04/14/2018 06:22    Procedures Procedures (including critical care time)  Medications Ordered in ED Medications  acetaminophen (TYLENOL) tablet 1,000 mg (has no administration in time range)  ibuprofen (ADVIL,MOTRIN) tablet 800 mg (has no administration in time range)      Final Clinical Impressions(s) / ED Diagnoses    Return for pain, numbness, changes in vision or speech, fevers >100.4 unrelieved by medication, shortness of breath, intractable vomiting, or diarrhea, abdominal pain, Inability to tolerate liquids or food, cough, altered mental status or any concerns. No signs of systemic illness or infection. The patient is nontoxic-appearing on exam and vital signs are within normal limits. Will refer to urology for microscopy hematuria as patient is asymptomatic.  I have reviewed the triage vital signs and the nursing notes. Pertinent labs &imaging results that were available during my care of the patient were reviewed by me and  considered in my medical decision making (see chart for details).  After history, exam, and medical workup I feel the patient has been appropriately medically screened and is safe for discharge home. Pertinent diagnoses were discussed with the patient. Patient was given return precautions.      Daemian Gahm, MD 04/14/18 [phone]

## 2018-09-17 IMAGING — DX DG FINGER THUMB 2+V*L*
3 series · 3 of 3 positions shown · non-contrast
Comparison: None.

CLINICAL DATA: Left thumb pain after injury. Jammed thumb last
night while cleaning. Swelling.

EXAM:
LEFT THUMB 2+V

[finger ap]
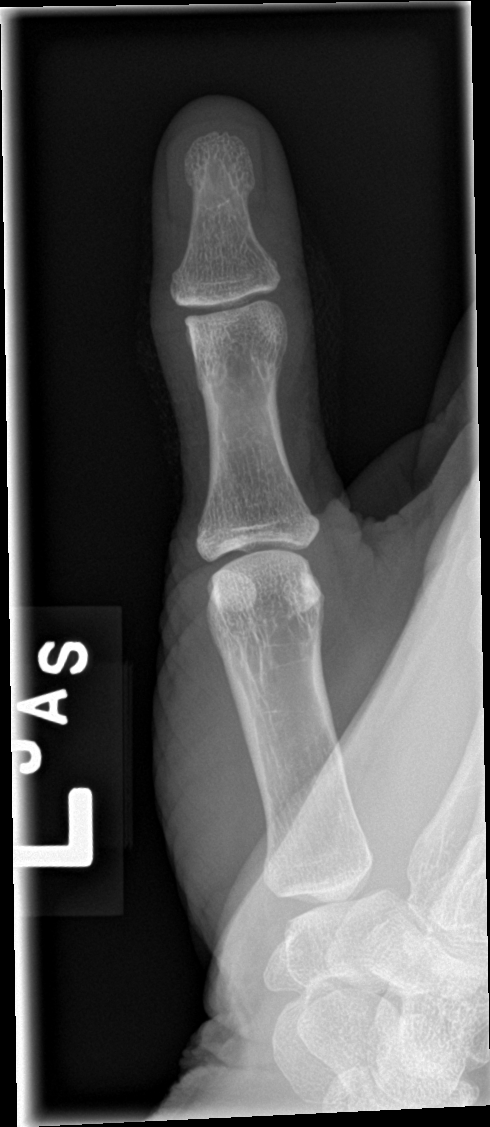

[finger obl]
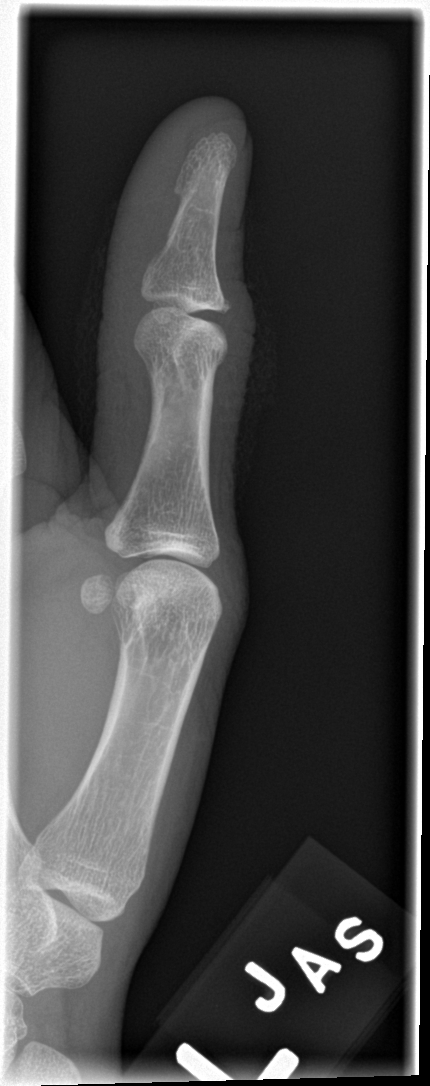

[finger lat]
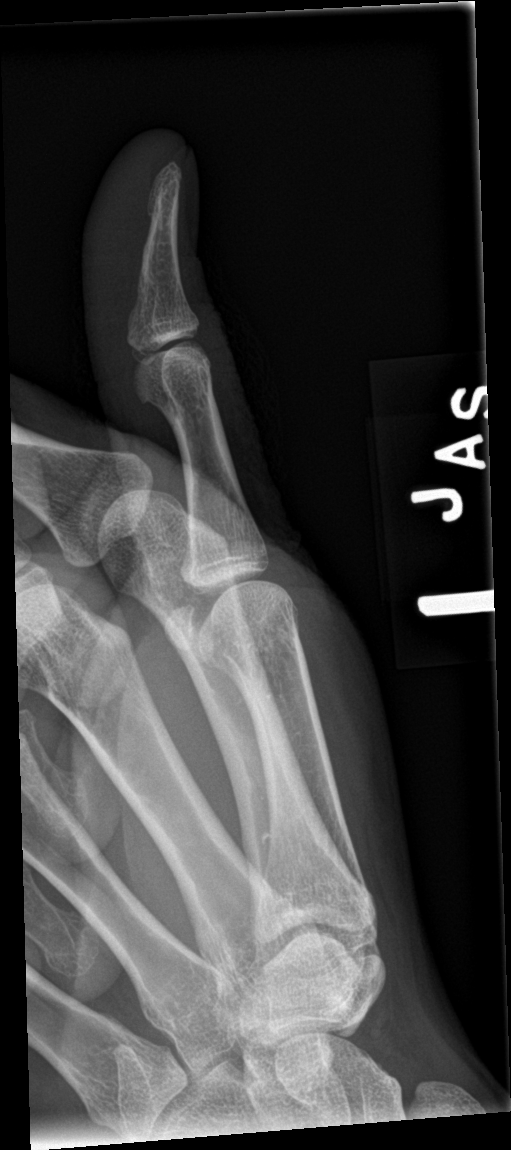

[3 of 3 positions shown; findings below may reference images not displayed]

FINDINGS: There is no evidence of fracture or dislocation. Accessory ossicle
at the thumb interphalangeal joint. There is no evidence of
arthropathy or other focal bone abnormality. Soft tissues are
unremarkable
IMPRESSION: Negative radiographs of the left thumb.

## 2019-06-03 ENCOUNTER — Emergency Department (HOSPITAL_COMMUNITY)
Admission: EM | Admit: 2019-06-03 | Discharge: 2019-06-03 | Disposition: A | Discharge status: Home / Self Care | Payer: Self-pay | Attending: Emergency Medicine | Admitting: Emergency Medicine

## 2019-06-03 ENCOUNTER — Encounter (HOSPITAL_COMMUNITY): Payer: Self-pay | Admitting: Emergency Medicine

## 2019-06-03 ENCOUNTER — Encounter: Payer: Self-pay | Admitting: Family Medicine

## 2019-06-03 DIAGNOSIS — N939 Abnormal uterine and vaginal bleeding, unspecified: Secondary | ICD-10-CM | POA: Insufficient documentation

## 2019-06-03 DIAGNOSIS — B9689 Other specified bacterial agents as the cause of diseases classified elsewhere: Secondary | ICD-10-CM

## 2019-06-03 DIAGNOSIS — Z79899 Other long term (current) drug therapy: Secondary | ICD-10-CM | POA: Insufficient documentation

## 2019-06-03 DIAGNOSIS — F17210 Nicotine dependence, cigarettes, uncomplicated: Secondary | ICD-10-CM | POA: Insufficient documentation

## 2019-06-03 LAB — I-STAT BETA HCG BLOOD, ED (MC, WL, AP ONLY): I-stat hCG, quantitative: <5 m[IU]/mL (ref <5)

## 2019-06-03 LAB — CBC WITH DIFFERENTIAL/PLATELET
Abs Immature Granulocytes: 0.01 10*3/uL (ref 0.00–0.07)
Basophils Absolute: 0.1 10*3/uL (ref 0.0–0.1)
Basophils Relative: 1 %
Eosinophils Absolute: 0.3 10*3/uL (ref 0.0–0.5)
Eosinophils Relative: 7 %
HCT: 36.7 % (ref 36.0–46.0)
Hemoglobin: 11.8 g/dL — ABNORMAL LOW (ref 12.0–15.0)
Immature Granulocytes: 0 %
Lymphocytes Relative: 38 %
Lymphs Abs: 1.9 10*3/uL (ref 0.7–4.0)
MCH: 30.1 pg (ref 26.0–34.0)
MCHC: 32.2 g/dL (ref 30.0–36.0)
MCV: 93.6 fL (ref 80.0–100.0)
Monocytes Absolute: 0.4 10*3/uL (ref 0.1–1.0)
Monocytes Relative: 8 %
Neutro Abs: 2.3 10*3/uL (ref 1.7–7.7)
Neutrophils Relative %: 46 %
Platelets: 190 10*3/uL (ref 150–400)
RBC: 3.92 MIL/uL (ref 3.87–5.11)
RDW: 13.3 % (ref 11.5–15.5)
WBC: 4.9 10*3/uL (ref 4.0–10.5)
nRBC: 0 % (ref 0.0–0.2)

## 2019-06-03 LAB — BASIC METABOLIC PANEL
Anion gap: 9 (ref 5–15)
BUN: 5 mg/dL — ABNORMAL LOW (ref 6–20)
CO2: 24 mmol/L (ref 22–32)
Calcium: 9.2 mg/dL (ref 8.9–10.3)
Chloride: 106 mmol/L (ref 98–111)
Creatinine, Ser: 0.74 mg/dL (ref 0.44–1.00)
GFR calc Af Amer: >60 mL/min (ref >60)
GFR calc non Af Amer: >60 mL/min (ref >60)
Glucose, Bld: 83 mg/dL (ref 70–99)
Potassium: 3.8 mmol/L (ref 3.5–5.1)
Sodium: 139 mmol/L (ref 135–145)

## 2019-06-03 LAB — WET PREP, GENITAL
Sperm: NONE SEEN
Trich, Wet Prep: NONE SEEN
Yeast Wet Prep HPF POC: NONE SEEN

## 2019-06-03 MED ORDER — METRONIDAZOLE 500 MG PO TABS: 500.0000 mg | ORAL_TABLET | Freq: Two times a day (BID) | ORAL | 0 refills | Status: DC

## 2019-06-03 MED ORDER — NORGESTIMATE-ETH ESTRADIOL 0.25-35 MG-MCG PO TABS: 1.0000 | ORAL_TABLET | Freq: Every day | ORAL | 2 refills | Status: DC

## 2019-06-03 NOTE — ED Notes (Signed)
Patient verbalizes understanding of discharge instructions . Opportunity for questions and answers were provided . Armband removed by staff ,Pt discharged from ED. W/C  offered at D/C  and Declined W/C at D/C and was escorted to lobby by RN.  

## 2019-06-03 NOTE — Discharge Instructions (Signed)
You have been seen today for abnormal vaginal bleeding. There was evidence of possible bacterial vaginosis.  Please take all of your antibiotics until finished!   You may develop abdominal discomfort or diarrhea from the antibiotic.  You may help offset this with probiotics which you can buy or get in yogurt. Do not eat or take the probiotics until 2 hours after your antibiotic.   Your lab work was otherwise reassuring. We will start you on oral contraceptive pills.  Please begin taking these, as prescribed.  Please note there are things that would make it so you should not take these pills.  One of those is tobacco smoking (may cause stroke or blood clots).  Another is pregnancy.  For a more expensive list, please pay special attention to the insert delivered with the medication from the pharmacy.  Follow-up with OB/GYN for any further management of this issue.

## 2019-06-03 NOTE — ED Provider Notes (Signed)
[facility] EMERGENCY DEPARTMENT Provider Note   CSN: [id number] Arrival date & time: 06/03/19  1026     History   Chief Complaint Chief Complaint  Patient presents with  . Vaginal Bleeding    HPI Kerri Baldwin is a 38 y.o. female.     HPI   Kerri Baldwin is a 38 y.o. female, with a history of anemia, presenting to the ED with abnormal vaginal bleeding for the last 3 months.  She has been experiencing one episode a month of steady vaginal bleeding that she labels as her period, but the timing has been inconsistent.  In between these episodes, she has been experiencing spotting. Prior to 3 months ago, her periods were regular and no bleeding inbetween. She has been under increased stress lately, but denies dietary changes, stressful life events, taking of supplements, changes in medications. She is sexually active with one female partner with whom she has been in a relationship for 2 years. She drinks 1-2 beers daily.  She regularly uses marijuana, but denies other illicit drugs. She has socially smoked black and mild cigars in the past, but none recently.  Denies fever/chills, N/V/D, abdominal pain, chest pain, shortness of breath, dizziness/lightheadedness, syncope, urinary discomfort or symptoms, abnormal vaginal discharge, unintentional weight changes, or any other complaints.    Past Medical History:  Diagnosis Date  . Anemia   . Back pain     Patient Active Problem List   Diagnosis Date Noted  . Abdominal pain   . Transaminitis 06/20/2015  . Abnormal transaminases 06/20/2015  . Hypokalemia 06/20/2015  . Anemia 06/20/2015    Past Surgical History:  Procedure Laterality Date  . INDUCED ABORTION    . WISDOM TOOTH EXTRACTION       OB History    Gravida  1   Para      Term      Preterm      AB  1   Living  2     SAB  1   TAB      Ectopic      Multiple      Live Births               Home Medications    Prior to  Admission medications   Medication Sig Start Date End Date Taking? Authorizing Provider  ibuprofen (ADVIL,MOTRIN) 600 MG tablet Take 1 tablet (600 mg total) by mouth every 6 (six) hours as needed. 03/28/14   Delos Haring, PA-C  ibuprofen (ADVIL,MOTRIN) 800 MG tablet Take 1 tablet (800 mg total) by mouth 3 (three) times daily. 04/14/18   Palumbo, April, MD  metroNIDAZOLE (FLAGYL) 500 MG tablet Take 1 tablet (500 mg total) by mouth 2 (two) times daily. 06/03/19   Leeanna Slaby C, PA-C  Multiple Vitamin (MULTIVITAMIN) tablet Take 1 tablet by mouth daily.    [provider]  norgestimate-ethinyl estradiol ([place] 28) 0.25-35 MG-MCG tablet Take 1 tablet by mouth daily. 06/03/19   [place] Strieter, Helane Gunther, PA-C    Family History Family History  Problem Relation Age of Onset  . Breast cancer Paternal Grandmother   . Bone cancer Cousin   . Cancer Maternal Grandfather        type unknown  . Cancer Paternal Uncle        type unknown  . Diabetes Father   . Diabetes Paternal Uncle     Social History Social History   Tobacco Use  . Smoking status: Current Every Day Smoker  Packs/day: 0.25    Types: Cigarettes  . Smokeless tobacco: Never Used  Substance Use Topics  . Alcohol use: Yes    Alcohol/week: 0.0 standard drinks    Comment: occ  . Drug use: Yes    Frequency: 7.0 times per week    Types: Marijuana     Allergies   Patient has no known allergies.   Review of Systems Review of Systems  Constitutional: Negative for chills, diaphoresis and fever.  Respiratory: Negative for cough and shortness of breath.   Cardiovascular: Negative for chest pain.  Gastrointestinal: Negative for abdominal pain, diarrhea, nausea and vomiting.  Genitourinary: Positive for menstrual problem and vaginal bleeding. Negative for difficulty urinating, dysuria and flank pain.  Musculoskeletal: Negative for back pain.  Neurological: Negative for dizziness, syncope, weakness, light-headedness and numbness.   All other systems reviewed and are negative.    Physical Exam Updated Vital Signs BP 106/70 (BP Location: Right Arm)   Pulse 95   Temp 99 F (37.2 C) (Oral)   Resp 16   LMP 05/20/2019   SpO2 99%   Physical Exam Vitals signs and nursing note reviewed.  Constitutional:      General: She is not in acute distress.    Appearance: She is well-developed. She is not diaphoretic.  HENT:     Head: Normocephalic and atraumatic.     Mouth/Throat:     Mouth: Mucous membranes are moist.     Pharynx: Oropharynx is clear.  Eyes:     Conjunctiva/sclera: Conjunctivae normal.  Neck:     Musculoskeletal: Neck supple.  Cardiovascular:     Rate and Rhythm: Normal rate and regular rhythm.     Pulses: Normal pulses.          Radial pulses are 2+ on the right side and 2+ on the left side.       Posterior tibial pulses are 2+ on the right side and 2+ on the left side.     Heart sounds: Normal heart sounds.     Comments: Tactile temperature in the extremities appropriate and equal bilaterally. Pulmonary:     Effort: Pulmonary effort is normal. No respiratory distress.     Breath sounds: Normal breath sounds.  Abdominal:     Palpations: Abdomen is soft.     Tenderness: There is no abdominal tenderness. There is no guarding.  Genitourinary:    Comments: External genitalia normal Vagina without discharge Cervix  normal negative for cervical motion tenderness Adnexa palpated, no masses, negative for tenderness noted Bladder palpated negative for tenderness Uterus palpated no masses, negative for tenderness  No inguinal lymphadenopathy. Otherwise normal female genitalia. Med Tech served as chaperone during exam. Musculoskeletal:     Right lower leg: No edema.     Left lower leg: No edema.  Lymphadenopathy:     Cervical: No cervical adenopathy.  Skin:    General: Skin is warm and dry.  Neurological:     Mental Status: She is alert.  Psychiatric:        Mood and Affect: Mood and affect  normal.        Speech: Speech normal.        Behavior: Behavior normal.      ED Treatments / Results  Labs (all labs ordered are listed, but only abnormal results are displayed) Labs Reviewed  WET PREP, GENITAL - Abnormal; Notable for the following components:      Result Value   Clue Cells Wet Prep HPF POC PRESENT (*)  WBC, Wet Prep HPF POC MODERATE (*)    All other components within normal limits  BASIC METABOLIC PANEL - Abnormal; Notable for the following components:   BUN 5 (*)    All other components within normal limits  CBC WITH DIFFERENTIAL/PLATELET - Abnormal; Notable for the following components:   Hemoglobin 11.8 (*)    All other components within normal limits  RPR  HIV ANTIBODY (ROUTINE TESTING W REFLEX)  I-STAT BETA HCG BLOOD, ED ([facility], [facility], [facility] ONLY)  GC/CHLAMYDIA PROBE AMP ([place]) NOT AT [facility]    EKG None  Radiology No results found.  Procedures Procedures (including critical care time)  Medications Ordered in ED Medications - No data to display   Initial Impression / Assessment and Plan / ED Course  I have reviewed the triage vital signs and the nursing notes.  Pertinent labs & imaging results that were available during my care of the patient were reviewed by me and considered in my medical decision making (see chart for details).  Clinical Course as of Jun 03 1351  Wed Jun 03, 2019  1319 Spoke with Shawna Orleans, NP on call at the [facility].  Reviewed the patient's history and lab work. She recommends starting the patient on Sprintec as long as she does not have any contraindications.  She can start taking these immediately. Send a message to [facility] clinic admin pool requesting patient be seen in clinic.   [SJ]    Clinical Course User Index [SJ] Ryna Beckstrom C, PA-C       Patient presents with vaginal bleeding and irregular menstrual cycle. Patient is nontoxic appearing, afebrile, not tachycardic, not tachypneic, not hypotensive, excellent SPO2 on  room air, and is in no apparent distress.  Abdominal exam benign.  Pelvic exam without noted abnormality. Lab work overall reassuring.  Clue cells were noted on the wet prep. The patient was given instructions for home care as well as return precautions. Patient voices understanding of these instructions, accepts the plan, and is comfortable with discharge.  I reviewed each of the contraindications found on Micromedex for Sprintec. Patient denies each of these. She has a poast history of smoking, but none recently. We discussed that if she is taking OCPs she should not smoke. The risks of doing so were discussed with the patient.  She voiced understanding.    Final Clinical Impressions(s) / ED Diagnoses   Final diagnoses:  Vaginal bleeding    ED Discharge Orders         Ordered    metroNIDAZOLE (FLAGYL) 500 MG tablet  2 times daily     06/03/19 1352    norgestimate-ethinyl estradiol ([place] 28) 0.25-35 MG-MCG tablet  Daily     06/03/19 1352           Anselm Pancoast, PA-C 06/03/19 1352    Terrilee Files, MD 06/04/19 1321

## 2019-06-03 NOTE — ED Triage Notes (Signed)
Pt states she has had irregular spotting for over 2 months now. Pt states she is not having heavy bleeding just constant spotting but did have normal period 2 weeks ago but never stopped spotting. Pt denies any pain. Pt did take a pregnancy test one month ago that was negative.

## 2019-06-04 LAB — GC/CHLAMYDIA PROBE AMP (~~LOC~~) NOT AT ARMC
Chlamydia: NEGATIVE
Neisseria Gonorrhea: NEGATIVE

## 2019-06-04 LAB — HIV ANTIBODY (ROUTINE TESTING W REFLEX): HIV Screen 4th Generation wRfx: NONREACTIVE

## 2019-06-04 LAB — RPR: RPR Ser Ql: NONREACTIVE

## 2019-06-30 ENCOUNTER — Telehealth: Payer: Self-pay | Admitting: Obstetrics and Gynecology

## 2019-06-30 NOTE — Telephone Encounter (Signed)
Spoke to patient about her appointment on 9/23 @ 10:55. Patient instructed to wear a face mask for the entire appointment and no visitors are allowed with her during the visit. Patient screened for covid symptoms and denied having any

## 2019-07-01 ENCOUNTER — Other Ambulatory Visit: Payer: Self-pay

## 2019-07-01 ENCOUNTER — Ambulatory Visit (INDEPENDENT_AMBULATORY_CARE_PROVIDER_SITE_OTHER): Payer: Self-pay | Admitting: Obstetrics and Gynecology

## 2019-07-01 ENCOUNTER — Encounter: Payer: Self-pay | Admitting: Obstetrics and Gynecology

## 2019-07-01 VITALS — BP 103/68 | HR 99 | Ht 68.0 in | Wt 158.0 lb

## 2019-07-01 DIAGNOSIS — N939 Abnormal uterine and vaginal bleeding, unspecified: Secondary | ICD-10-CM

## 2019-07-01 LAB — POCT PREGNANCY, URINE: Preg Test, Ur: NEGATIVE

## 2019-07-01 LAB — POCT URINALYSIS DIP (DEVICE)
Glucose, UA: NEGATIVE mg/dL
Leukocytes,Ua: NEGATIVE
Nitrite: NEGATIVE
Protein, ur: NEGATIVE mg/dL
Specific Gravity, Urine: 1.025 (ref 1.005–1.030)
Urobilinogen, UA: 0.2 mg/dL (ref 0.0–1.0)
pH: 6 (ref 5.0–8.0)

## 2019-07-01 MED ORDER — NORGESTIMATE-ETH ESTRADIOL 0.25-35 MG-MCG PO TABS: 1.0000 | ORAL_TABLET | Freq: Every day | ORAL | 0 refills | Status: AC

## 2019-07-01 NOTE — Patient Instructions (Signed)
Look into family planning medicaid and breast cancer and cervical cancer screening program BCCCP  I recommend a pap smear, TSH and free t4 testing, to continue on the birth control pills for 3-4 months.

## 2019-07-01 NOTE — Progress Notes (Signed)
Pt states is still having light spotting, was Rx antibiotic & BC but still having bleeding. Pt stated has been having back pain for the last 2 days.

## 2019-07-01 NOTE — Progress Notes (Addendum)
Obstetrics and Gynecology New Patient Evaluation  Appointment Date: 07/01/2019  OBGYN Clinic: [facility]  Primary Care Provider: Patient, No Pcp Per  Referring Provider: No ref. provider found  Chief Complaint: ED follow up  History of Present Illness: Kerri Baldwin is a 38 y.o. African-American G2P0020 (LMP: 10 days ago), seen for the above chief complaint.   Patient  Wet to ED on 8/26 for VB. She states that her period is about is regular and qmonth and lasts for about 4-6 days and not particularly heavy or painful but then has a 2nd period about a 1-2wks after her initial one stops and it is discharge with old blood when she wipes. In the ED she had a negative UPT, u/a, CBC (Hgb 11.8), bmp, hiv, rpr, gc/ct and wet prep that only had clue cells. She states she did take the flagyl.   Currently no pain, VB, discharge, vaginal itching.   Review of Systems: as noted in the History of Present Illness.   Past Medical History:  Past Medical History:  Diagnosis Date  . Anemia   . Back pain     Past Surgical History:  Past Surgical History:  Procedure Laterality Date  . DILATION AND CURETTAGE, DIAGNOSTIC / THERAPEUTIC     TAB  . WISDOM TOOTH EXTRACTION      Past Obstetrical History:  OB History  Gravida Para Term Preterm AB Living  2       2 0  SAB TAB Ectopic Multiple Live Births  1 1          # Outcome Date GA Lbr Len/2nd Weight Sex Delivery Anes PTL Lv  2 TAB           1 SAB             Past Gynecological History: As per HPI. History of Pap Smear(s): Yes.   Last pap unknown, which was normal  She is currently using no method for contraception.   Social History:  Social History   Socioeconomic History  . Marital status: Single    Spouse name: Not on file  . Number of children: 0  . Years of education: Not on file  . Highest education level: Not on file  Occupational History  . Occupation: Geographical information systems officer  . Financial resource  strain: Not on file  . Food insecurity    Worry: Not on file    Inability: Not on file  . Transportation needs    Medical: Not on file    Non-medical: Not on file  Tobacco Use  . Smoking status: Former Smoker    Packs/day: 0.25    Types: Cigarettes  . Smokeless tobacco: Never Used  Substance and Sexual Activity  . Alcohol use: Yes    Alcohol/week: 0.0 standard drinks    Comment: occ  . Drug use: Yes    Frequency: 7.0 times per week    Types: Marijuana  . Sexual activity: Not Currently    Birth control/protection: Pill  Lifestyle  . Physical activity    Days per week: Not on file    Minutes per session: Not on file  . Stress: Not on file  Relationships  . Social Herbalist on phone: Not on file    Gets together: Not on file    Attends religious service: Not on file    Active member of club or organization: Not on file    Attends meetings of clubs or organizations:  Not on file    Relationship status: Not on file  . Intimate partner violence    Fear of current or ex partner: Not on file    Emotionally abused: Not on file    Physically abused: Not on file    Forced sexual activity: Not on file  Other Topics Concern  . Not on file  Social History Narrative  . Not on file    Family History:  Family History  Problem Relation Age of Onset  . Breast cancer Paternal Grandmother   . Bone cancer Cousin   . Cancer Maternal Grandfather        type unknown  . Cancer Paternal Uncle        type unknown  . Diabetes Father   . Diabetes Paternal Uncle    She denies any female cancers, bleeding or blood clotting disorders.   Medications Kerri Baldwin had no medications administered during this visit. Current Outpatient Medications  Medication Sig Dispense Refill  . ibuprofen (ADVIL,MOTRIN) 600 MG tablet Take 1 tablet (600 mg total) by mouth every 6 (six) hours as needed. 30 tablet 0  . ibuprofen (ADVIL,MOTRIN) 800 MG tablet Take 1 tablet (800 mg total) by mouth  3 (three) times daily. 21 tablet 0  . Multiple Vitamin (MULTIVITAMIN) tablet Take 1 tablet by mouth daily.     No current facility-administered medications for this visit.     Allergies Patient has no known allergies.   Physical Exam:  BP 103/68   Pulse 99   Ht 5\' 8"  (1.727 m)   Wt 158 lb (71.7 kg)   BMI 24.02 kg/m  Body mass index is 24.02 kg/m.  General appearance: Well nourished, well developed female in no acute distress.  Neck:  Supple, normal appearance, and no thyromegaly  Cardiovascular: normal s1 and s2.  No murmurs, rubs or gallops. Respiratory:  Clear to auscultation bilateral. Normal respiratory effort Abdomen: positive bowel sounds and no masses, hernias; diffusely non tender to palpation, non distended Neuro/Psych:  Normal mood and affect.  Skin:  Warm and dry.  Lymphatic:  No inguinal lymphadenopathy.   Pelvic exam: is not limited by body habitus EGBUS: within normal limits, Vagina: within normal limits and with no blood or discharge in the vault, Cervix: normal appearing cervix without tenderness, discharge or lesions. Uterus:  8-10wk sized, mobile, and non tender and Adnexa:  normal adnexa and no mass, fullness, tenderness Rectovaginal: deferred  Laboratory: UPT negative  Radiology: none  Assessment: pt stable  Plan:  1. Abnormal uterine bleeding D/w her that I recommend getting and u/s given her s/s and physical exam. Pt is self pay and declines this. She has no contraindications to OCPs and r/b/a d/w her and she is amenable to a trial of OCPs.   BCCCP and family planning medicaid information given to patient   RTC 3 months  MD Attending [facility] [employer])

## 2019-07-02 ENCOUNTER — Encounter: Payer: Self-pay | Admitting: Obstetrics and Gynecology

## 2019-07-06 ENCOUNTER — Encounter: Payer: Self-pay | Admitting: Obstetrics and Gynecology

## 2024-10-11 ENCOUNTER — Encounter (HOSPITAL_COMMUNITY): Payer: Self-pay | Admitting: *Deleted

## 2024-10-11 ENCOUNTER — Ambulatory Visit (HOSPITAL_COMMUNITY)
Admission: EM | Admit: 2024-10-11 | Discharge: 2024-10-11 | Disposition: A | Discharge status: Home / Self Care | Attending: Internal Medicine | Admitting: Internal Medicine

## 2024-10-11 ENCOUNTER — Ambulatory Visit (HOSPITAL_COMMUNITY): Payer: Self-pay | Admitting: Internal Medicine

## 2024-10-11 DIAGNOSIS — M545 Low back pain, unspecified: Secondary | ICD-10-CM | POA: Diagnosis not present

## 2024-10-11 DIAGNOSIS — N939 Abnormal uterine and vaginal bleeding, unspecified: Secondary | ICD-10-CM | POA: Diagnosis not present

## 2024-10-11 LAB — CBC
HCT: 36 % (ref 36.0–46.0)
Hemoglobin: 12 g/dL (ref 12.0–15.0)
MCH: 29.2 pg (ref 26.0–34.0)
MCHC: 33.3 g/dL (ref 30.0–36.0)
MCV: 87.6 fL (ref 80.0–100.0)
Platelets: 251 K/uL (ref 150–400)
RBC: 4.11 MIL/uL (ref 3.87–5.11)
RDW: 13.4 % (ref 11.5–15.5)
WBC: 6.6 K/uL (ref 4.0–10.5)
nRBC: 0 % (ref 0.0–0.2)

## 2024-10-11 LAB — BASIC METABOLIC PANEL WITH GFR
Anion gap: 9 (ref 5–15)
BUN: 13 mg/dL (ref 6–20)
CO2: 24 mmol/L (ref 22–32)
Calcium: 9.2 mg/dL (ref 8.9–10.3)
Chloride: 104 mmol/L (ref 98–111)
Creatinine, Ser: 0.74 mg/dL (ref 0.44–1.00)
GFR, Estimated: >60 mL/min
Glucose, Bld: 83 mg/dL (ref 70–99)
Potassium: 4.2 mmol/L (ref 3.5–5.1)
Sodium: 137 mmol/L (ref 135–145)

## 2024-10-11 LAB — POCT URINE PREGNANCY: Preg Test, Ur: NEGATIVE

## 2024-10-11 MED ORDER — IBUPROFEN 600 MG PO TABS: 600.0000 mg | ORAL_TABLET | Freq: Four times a day (QID) | ORAL | 0 refills | Status: AC | PRN

## 2024-10-11 NOTE — ED Provider Notes (Signed)
 " [facility]    CSN: [id number] Arrival date & time: 10/11/24  1400      History   Chief Complaint Chief Complaint  Patient presents with   Back Pain   Vaginal Bleeding    HPI Kerri Baldwin is a 44 y.o. female.   Kerri Baldwin is a 44 y.o. female presenting for chief complaint of back pain that started last night and vaginal bleeding start this morning. No clots in bleeding, reports normal regular menstrual appearing vaginal bleeding. She is not experiencing abdominal cramping like she normally does with her cycle, she states she rarely even has cramps with her menstrual cycle. No vaginal discharge, odor, itching. No lightheadedness or dizziness. No urinary symptoms, flank pain, nausea, vomiting, diarrhea, fever/chills, recent falls, saddle anesthesia, unilateral extremity weakness, or paresthesias to the extremities.  Denies recent trauma/injuries to the low back and previous history of back surgeries.  Menstrual cycles are usually very regular, never irregular. She has not been sexually active in the last 6 months and denies concern for STD.   Right low back pain feels like a pressure sensation that is worse with movement and activities that cause increased abdominal pressure like coughing, sneezing, and deep breaths.  Back pain resolves completely when she is sitting still.  Pain does not radiate to the extremities or the abdomen and is localized to the right side of the back. She has not attempted treatment of low back pain at home.     Back Pain Vaginal Bleeding Associated symptoms: back pain     Past Medical History:  Diagnosis Date   Anemia    Back pain     Patient Active Problem List   Diagnosis Date Noted   Transaminitis 06/20/2015   Abnormal transaminases 06/20/2015   Anemia 06/20/2015    Past Surgical History:  Procedure Laterality Date   DILATION AND CURETTAGE, DIAGNOSTIC / THERAPEUTIC     TAB   WISDOM TOOTH EXTRACTION      OB History      Gravida  2   Para      Term      Preterm      AB  2   Living  0      SAB  1   IAB  1   Ectopic      Multiple      Live Births               Home Medications    Prior to Admission medications  Medication Sig Start Date End Date Taking? Authorizing Provider  Ferrous Sulfate (IRON PO) Take by mouth.   Yes [provider]  ibuprofen  (ADVIL ) 600 MG tablet Take 1 tablet (600 mg total) by mouth every 6 (six) hours as needed. 10/11/24  Yes Enedelia Dorna HERO, FNP  Multiple Vitamin (MULTIVITAMIN) tablet Take 1 tablet by mouth daily.   Yes [provider]  norgestimate -ethinyl estradiol  (SPRINTEC 28) 0.25-35 MG-MCG tablet Take 1 tablet by mouth daily. 07/01/19   Izell Harari, MD    Family History Family History  Problem Relation Age of Onset   Breast cancer Paternal Grandmother    Bone cancer Cousin    Cancer Maternal Grandfather        type unknown   Cancer Paternal Uncle        type unknown   Diabetes Father    Diabetes Paternal Uncle     Social History Social History[1]   Allergies   Patient has no known  allergies.   Review of Systems Review of Systems  Genitourinary:  Positive for vaginal bleeding.  Musculoskeletal:  Positive for back pain.  Per HPI   Physical Exam Triage Vital Signs ED Triage Vitals  Encounter Vitals Group     BP 10/11/24 1547 (!) 143/81     Girls Systolic BP Percentile --      Girls Diastolic BP Percentile --      Boys Systolic BP Percentile --      Boys Diastolic BP Percentile --      Pulse Rate 10/11/24 1547 68     Resp 10/11/24 1547 16     Temp 10/11/24 1547 98.4 F (36.9 C)     Temp Source 10/11/24 1547 Oral     SpO2 10/11/24 1547 96 %     Weight --      Height --      Head Circumference --      Peak Flow --      Pain Score 10/11/24 1548 2     Pain Loc --      Pain Education --      Exclude from Growth Chart --    No data found.  Updated Vital Signs BP (!) 143/81   Pulse 68    Temp 98.4 F (36.9 C) (Oral)   Resp 16   LMP 09/25/2024 Comment: started with vaginal bleeding 10/11/24 again  SpO2 96%   Visual Acuity Right Eye Distance:   Left Eye Distance:   Bilateral Distance:    Right Eye Near:   Left Eye Near:    Bilateral Near:     Physical Exam Vitals and nursing note reviewed.  Constitutional:      Appearance: She is not ill-appearing or toxic-appearing.  HENT:     Head: Normocephalic and atraumatic.     Right Ear: Hearing and external ear normal.     Left Ear: Hearing and external ear normal.     Nose: Nose normal.     Mouth/Throat:     Lips: Pink.  Eyes:     General: Lids are normal. Vision grossly intact. Gaze aligned appropriately.     Extraocular Movements: Extraocular movements intact.     Conjunctiva/sclera: Conjunctivae normal.  Pulmonary:     Effort: Pulmonary effort is normal.  Musculoskeletal:     Cervical back: Normal and neck supple.     Thoracic back: Normal.     Lumbar back: Tenderness (TTP over the right paraspinous musculature, non-tender to midline spine.) present. No swelling, edema, deformity, signs of trauma, lacerations, spasms or bony tenderness. Normal range of motion. Negative right straight leg raise test and negative left straight leg raise test. No scoliosis.     Comments: 5/5 strength against resistance to bilateral lower extremities, sensation intact to distal BLE. Ambulatory with steady gait without abnormality.   Skin:    General: Skin is warm and dry.     Capillary Refill: Capillary refill takes less than 2 seconds.     Findings: No rash.  Neurological:     General: No focal deficit present.     Mental Status: She is alert and oriented to person, place, and time. Mental status is at baseline.     Cranial Nerves: No dysarthria or facial asymmetry.  Psychiatric:        Mood and Affect: Mood normal.        Speech: Speech normal.        Behavior: Behavior normal.  Thought Content: Thought content normal.         Judgment: Judgment normal.      UC Treatments / Results  Labs (all labs ordered are listed, but only abnormal results are displayed) Labs Reviewed  CBC  BASIC METABOLIC PANEL WITH GFR  POCT URINE PREGNANCY    EKG   Radiology No results found.  Procedures Procedures (including critical care time)  Medications Ordered in UC Medications - No data to display  Initial Impression / Assessment and Plan / UC Course  I have reviewed the triage vital signs and the nursing notes.  Pertinent labs & imaging results that were available during my care of the patient were reviewed by me and considered in my medical decision making (see chart for details).   1.  Vaginal bleeding, acute right sided low back pain without sciatica Back pain consistent with acute muscle spasm of the L-spine, minimally tender on exam.  No red flags on exam, low suspicion for cauda equina/sciatic nerve involvement. Will trial treatment with ibuprofen  as needed for pain, heat, and exercises/stretches for back pain.  Follow-up with primary care provider as needed.  Unclear cause of patient's vaginal bleeding.  Urine pregnancy test is negative.  CBC and BMP are pending to evaluate for signs of new/worsening iron deficiency anemia, she takes iron supplements daily due to history of anemia.  Will call if blood work is abnormal.  Last blood work performed on file was in 2020.  Deferred STD testing as there is no concern for STD from patient.  No urinary symptoms, therefore we did not run a urinalysis.  Advised to return to clinic or go to the emergency department should she develop dizziness, shortness of breath, palpitations, or persistent bleeding with large clots. Otherwise, follow-up with PCP.  Counseled patient on potential for adverse effects with medications prescribed/recommended today, strict ER and return-to-clinic precautions discussed, patient verbalized understanding.    Final Clinical Impressions(s) /  UC Diagnoses   Final diagnoses:  Vaginal bleeding  Acute right-sided low back pain without sciatica     Discharge Instructions      Is unclear what is causing your vaginal bleeding. Your pregnancy test is negative. I have drawn blood work today to ensure that your anemia is stable. Staff will call if blood work is abnormal.  You may also see these results on MyChart. You may take ibuprofen  600 mg every 6 hours as needed for back pain. Use heat and gentle range of motion exercises/stretches as needed for back pain as well.  I suspect that this is caused by muscle spasm. Please follow-up with your primary care provider for ongoing evaluation and management of symptoms.  Return to clinic if you become dizzy, short of breath, back pain becomes severe, or if you develop one-sided leg weakness/numbness or tingling to the groin or the legs.      ED Prescriptions     Medication Sig Dispense Auth. Provider   ibuprofen  (ADVIL ) 600 MG tablet Take 1 tablet (600 mg total) by mouth every 6 (six) hours as needed. 30 tablet Enedelia Dorna HERO, FNP      PDMP not reviewed this encounter.     [1]  Social History Tobacco Use   Smoking status: Every Day    Current packs/day: 0.25    Types: Cigarettes   Smokeless tobacco: Never  Vaping Use   Vaping status: Some Days  Substance Use Topics   Alcohol use: Not Currently    Comment: occasionally  Drug use: Not Currently    Types: Marijuana     Enedelia Dorna HERO, [place] 10/11/24 1639  "

## 2024-10-11 NOTE — Discharge Instructions (Addendum)
 Is unclear what is causing your vaginal bleeding. Your pregnancy test is negative. I have drawn blood work today to ensure that your anemia is stable. Staff will call if blood work is abnormal.  You may also see these results on MyChart. You may take ibuprofen  600 mg every 6 hours as needed for back pain. Use heat and gentle range of motion exercises/stretches as needed for back pain as well.  I suspect that this is caused by muscle spasm. Please follow-up with your primary care provider for ongoing evaluation and management of symptoms.  Return to clinic if you become dizzy, short of breath, back pain becomes severe, or if you develop one-sided leg weakness/numbness or tingling to the groin or the legs.

## 2024-10-11 NOTE — ED Triage Notes (Signed)
 C/O starting yesterday with lower right back pain while sitting last night. States feels different than back pain she's had in the past. Paint is worse with deep breathing or yawning or walking. Also states she started with vaginal bleeding today, but just had her normal menstrual cycle starting 09/25/24; states she is always very regular with her periods. Has tried IBU and heat. Denies parasthesias. Denies any n/v. Denies any abd pain. Denies any vaginal discharge.

## 2024-11-20 ENCOUNTER — Ambulatory Visit (HOSPITAL_BASED_OUTPATIENT_CLINIC_OR_DEPARTMENT_OTHER): Admitting: Family Medicine
